# Patient Record
Sex: Male | Born: 1950 | Race: White | Hispanic: No | Smoking: Never smoker
Health system: Southern US, Community
[De-identification: ages and names within clinical notes are randomized; demographics above are authoritative.]

## PROBLEM LIST (undated history)

## (undated) DIAGNOSIS — I2510 Atherosclerotic heart disease of native coronary artery without angina pectoris: Secondary | ICD-10-CM

## (undated) DIAGNOSIS — I10 Essential (primary) hypertension: Secondary | ICD-10-CM

## (undated) DIAGNOSIS — G4730 Sleep apnea, unspecified: Secondary | ICD-10-CM

## (undated) DIAGNOSIS — E119 Type 2 diabetes mellitus without complications: Secondary | ICD-10-CM

## (undated) DIAGNOSIS — G459 Transient cerebral ischemic attack, unspecified: Secondary | ICD-10-CM

## (undated) DIAGNOSIS — I639 Cerebral infarction, unspecified: Secondary | ICD-10-CM

## (undated) DIAGNOSIS — R0600 Dyspnea, unspecified: Secondary | ICD-10-CM

## (undated) DIAGNOSIS — K219 Gastro-esophageal reflux disease without esophagitis: Secondary | ICD-10-CM

## (undated) DIAGNOSIS — I213 ST elevation (STEMI) myocardial infarction of unspecified site: Secondary | ICD-10-CM

## (undated) DIAGNOSIS — F319 Bipolar disorder, unspecified: Secondary | ICD-10-CM

## (undated) DIAGNOSIS — J449 Chronic obstructive pulmonary disease, unspecified: Secondary | ICD-10-CM

## (undated) HISTORY — PX: CATARACT EXTRACTION W/ INTRAOCULAR LENS  IMPLANT, BILATERAL: SHX1307

## (undated) HISTORY — PX: SKIN GRAFT: SHX250

## (undated) HISTORY — PX: OTHER SURGICAL HISTORY: SHX169

## (undated) HISTORY — PX: KNEE ARTHROSCOPY: SHX127

## (undated) HISTORY — PX: CORONARY ARTERY BYPASS GRAFT: SHX141

## (undated) HISTORY — PX: COLON SURGERY: SHX602

## (undated) HISTORY — PX: WRIST ARTHROSCOPY: SUR100

---

## 2017-12-28 ENCOUNTER — Other Ambulatory Visit: Payer: Self-pay | Admitting: Orthopedic Surgery

## 2017-12-28 DIAGNOSIS — R29898 Other symptoms and signs involving the musculoskeletal system: Secondary | ICD-10-CM

## 2017-12-28 DIAGNOSIS — M7541 Impingement syndrome of right shoulder: Secondary | ICD-10-CM

## 2018-01-01 ENCOUNTER — Other Ambulatory Visit (HOSPITAL_COMMUNITY): Payer: Self-pay | Admitting: Orthopedic Surgery

## 2018-01-01 DIAGNOSIS — R29898 Other symptoms and signs involving the musculoskeletal system: Secondary | ICD-10-CM

## 2018-01-01 DIAGNOSIS — M7541 Impingement syndrome of right shoulder: Secondary | ICD-10-CM

## 2018-01-03 ENCOUNTER — Ambulatory Visit (HOSPITAL_COMMUNITY)
Admission: RE | Admit: 2018-01-03 | Discharge: 2018-01-03 | Disposition: A | Discharge status: Home / Self Care | Payer: Medicare Other | Source: Ambulatory Visit | Attending: Orthopedic Surgery | Admitting: Orthopedic Surgery

## 2018-01-03 DIAGNOSIS — M7541 Impingement syndrome of right shoulder: Secondary | ICD-10-CM | POA: Insufficient documentation

## 2018-01-03 DIAGNOSIS — M75111 Incomplete rotator cuff tear or rupture of right shoulder, not specified as traumatic: Secondary | ICD-10-CM | POA: Insufficient documentation

## 2018-01-03 DIAGNOSIS — R29898 Other symptoms and signs involving the musculoskeletal system: Secondary | ICD-10-CM

## 2018-01-12 ENCOUNTER — Ambulatory Visit: Payer: Self-pay

## 2018-01-14 DIAGNOSIS — M7581 Other shoulder lesions, right shoulder: Secondary | ICD-10-CM | POA: Insufficient documentation

## 2018-01-14 DIAGNOSIS — S46011A Strain of muscle(s) and tendon(s) of the rotator cuff of right shoulder, initial encounter: Secondary | ICD-10-CM | POA: Insufficient documentation

## 2018-02-21 ENCOUNTER — Emergency Department (HOSPITAL_BASED_OUTPATIENT_CLINIC_OR_DEPARTMENT_OTHER)
Admission: EM | Admit: 2018-02-21 | Discharge: 2018-02-21 | Disposition: A | Discharge status: Home / Self Care | Payer: Medicare Other | Attending: Emergency Medicine | Admitting: Emergency Medicine

## 2018-02-21 ENCOUNTER — Emergency Department (HOSPITAL_BASED_OUTPATIENT_CLINIC_OR_DEPARTMENT_OTHER): Payer: Medicare Other

## 2018-02-21 ENCOUNTER — Encounter (HOSPITAL_BASED_OUTPATIENT_CLINIC_OR_DEPARTMENT_OTHER): Payer: Self-pay

## 2018-02-21 DIAGNOSIS — I252 Old myocardial infarction: Secondary | ICD-10-CM | POA: Insufficient documentation

## 2018-02-21 DIAGNOSIS — G459 Transient cerebral ischemic attack, unspecified: Secondary | ICD-10-CM | POA: Diagnosis not present

## 2018-02-21 DIAGNOSIS — E1165 Type 2 diabetes mellitus with hyperglycemia: Secondary | ICD-10-CM | POA: Diagnosis not present

## 2018-02-21 DIAGNOSIS — I2510 Atherosclerotic heart disease of native coronary artery without angina pectoris: Secondary | ICD-10-CM | POA: Diagnosis not present

## 2018-02-21 DIAGNOSIS — I10 Essential (primary) hypertension: Secondary | ICD-10-CM | POA: Diagnosis not present

## 2018-02-21 DIAGNOSIS — R739 Hyperglycemia, unspecified: Secondary | ICD-10-CM

## 2018-02-21 DIAGNOSIS — R4182 Altered mental status, unspecified: Secondary | ICD-10-CM | POA: Diagnosis present

## 2018-02-21 HISTORY — DX: Cerebral infarction, unspecified: I63.9

## 2018-02-21 HISTORY — DX: Type 2 diabetes mellitus without complications: E11.9

## 2018-02-21 HISTORY — DX: Transient cerebral ischemic attack, unspecified: G45.9

## 2018-02-21 HISTORY — DX: Atherosclerotic heart disease of native coronary artery without angina pectoris: I25.10

## 2018-02-21 HISTORY — DX: ST elevation (STEMI) myocardial infarction of unspecified site: I21.3

## 2018-02-21 HISTORY — DX: Essential (primary) hypertension: I10

## 2018-02-21 LAB — CBG MONITORING, ED: Glucose-Capillary: 277 mg/dL — ABNORMAL HIGH (ref 70–99)

## 2018-02-21 LAB — COMPREHENSIVE METABOLIC PANEL
ALBUMIN: 3.9 g/dL (ref 3.5–5.0)
ALK PHOS: 77 U/L (ref 38–126)
ALT: 25 U/L (ref 0–44)
ANION GAP: 12 (ref 5–15)
AST: 19 U/L (ref 15–41)
BUN: 28 mg/dL — ABNORMAL HIGH (ref 8–23)
CALCIUM: 8.5 mg/dL — AB (ref 8.9–10.3)
CO2: 22 mmol/L (ref 22–32)
Chloride: 100 mmol/L (ref 98–111)
Creatinine, Ser: 0.96 mg/dL (ref 0.61–1.24)
GFR calc Af Amer: >60 mL/min (ref >60)
GFR calc non Af Amer: >60 mL/min (ref >60)
GLUCOSE: 274 mg/dL — AB (ref 70–99)
Potassium: 4 mmol/L (ref 3.5–5.1)
SODIUM: 134 mmol/L — AB (ref 135–145)
Total Bilirubin: 0.8 mg/dL (ref 0.3–1.2)
Total Protein: 6.9 g/dL (ref 6.5–8.1)

## 2018-02-21 LAB — CBC
HEMATOCRIT: 47.2 % (ref 39.0–52.0)
HEMOGLOBIN: 15.1 g/dL (ref 13.0–17.0)
MCH: 29.1 pg (ref 26.0–34.0)
MCHC: 32 g/dL (ref 30.0–36.0)
MCV: 90.9 fL (ref 80.0–100.0)
NRBC: 0 % (ref 0.0–0.2)
Platelets: 170 10*3/uL (ref 150–400)
RBC: 5.19 MIL/uL (ref 4.22–5.81)
RDW: 13 % (ref 11.5–15.5)
WBC: 10 10*3/uL (ref 4.0–10.5)

## 2018-02-21 LAB — DIFFERENTIAL
ABS IMMATURE GRANULOCYTES: 0.09 10*3/uL — AB (ref 0.00–0.07)
BASOS ABS: 0 10*3/uL (ref 0.0–0.1)
Basophils Relative: 0 %
EOS PCT: 2 %
Eosinophils Absolute: 0.2 10*3/uL (ref 0.0–0.5)
IMMATURE GRANULOCYTES: 1 %
LYMPHS PCT: 25 %
Lymphs Abs: 2.5 10*3/uL (ref 0.7–4.0)
Monocytes Absolute: 0.7 10*3/uL (ref 0.1–1.0)
Monocytes Relative: 7 %
NEUTROS ABS: 6.4 10*3/uL (ref 1.7–7.7)
Neutrophils Relative %: 65 %

## 2018-02-21 LAB — PROTIME-INR
INR: 1.02
Prothrombin Time: 13.3 seconds (ref 11.4–15.2)

## 2018-02-21 LAB — TROPONIN I: Troponin I: <0.03 ng/mL (ref <0.03)

## 2018-02-21 LAB — ETHANOL: Alcohol, Ethyl (B): <10 mg/dL (ref <10)

## 2018-02-21 LAB — APTT: aPTT: 28 seconds (ref 24–36)

## 2018-02-21 MED ORDER — SODIUM CHLORIDE 0.9 % IV BOLUS: 1000.0000 mL | Freq: Once | INTRAVENOUS | Status: DC

## 2018-02-21 NOTE — ED Triage Notes (Signed)
Per daughter they were out shopping and pt stated his eyes were tunnel vision and then he started mumbling words and confused x1715mins ago, gave 2 325mg  ASA and a BC powder 5 mins ago. Neg facial droop, neg drift, unable to tell me his bday

## 2018-02-21 NOTE — ED Notes (Signed)
ED Provider at bedside. 

## 2018-02-21 NOTE — Discharge Instructions (Addendum)
Thank you for allowing me to care for you today in the Emergency Department.   As we discussed, we offered you admission because your symptoms sounded consistent with a TIA, transient ischemic attack, also known as a mini stroke.  A TIA is concerning because it can sometimes be a warning sign that you may have a full-blown stroke in several days.  Continue to take all of your medications including aspirin.  Please follow-up with your primary care provider for recheck as soon as possible.  If you develop any symptoms where you have difficulty with your words, changes in your vision, facial droop, numbness or weakness, confusion, or other new, concerning symptoms, please return to the emergency department or the nearest emergency department if you are in [place] at any time for re-evaluation.

## 2018-02-21 NOTE — ED Provider Notes (Signed)
[facility] EMERGENCY DEPARTMENT Provider Note   CSN: [id number] Arrival date & time: 02/21/18  1701     History   Chief Complaint Chief Complaint  Patient presents with  . Altered Mental Status    HPI Roger Harper is a 67 y.o. male with a history of CAD s/p CABG x5, diabetes mellitus, HTN, STEMI, stroke, and TIA who presents to the emergency department with a chief complaint of dysarthria.  The patient states he was shopping at [employer] with his daughter when he suddenly developed bilateral tunnel vision.  He reports he has had similar issues with his vision before and he was concerned that his blood sugar was low so he ate a piece of pizza.  He reports the tunnel vision lasted for several minutes until they were leaving the building.  After the tunnel vision resolved, he developed dysarthria that lasted for approximately 30 minutes.  His daughter states during that time that she was asking him questions, but he had difficulty with his words.  She reports he has had similar issues in the past and his neurologist had asked him to try and read a sentence if it happened again.  The patient states that he was able to recognize the words on the paper and knew what he wanted to say, but was unable to get the words out.  She reports they stopped at a gas station and gave him 325 mg of ASA and a BC powder 5 minutes prior to arrival.  He denies of fever, chills, numbness, weakness, facial droop, slurred speech, headache, diplopia, chest pain, dyspnea, nausea, vomiting, or diarrhea.  The history is provided by the patient. No language interpreter was used.    Past Medical History:  Diagnosis Date  . Coronary artery disease   . Diabetes mellitus without complication (HCC)   . Hypertension   . STEMI (ST elevation myocardial infarction) (HCC)   . Stroke (HCC)   . TIA (transient ischemic attack)     There are no active problems to display for this patient.   Past Surgical  History:  Procedure Laterality Date  . CORONARY ARTERY BYPASS GRAFT          Home Medications    Prior to Admission medications   Not on File    Family History No family history on file.  Social History Social History   Tobacco Use  . Smoking status: Never Smoker  . Smokeless tobacco: Never Used  Substance Use Topics  . Alcohol use: Not Currently  . Drug use: Not Currently     Allergies   Penicillins and Tetracyclines & related   Review of Systems Review of Systems  Constitutional: Negative for appetite change and fever.  HENT: Negative for congestion.   Eyes: Positive for visual disturbance.  Respiratory: Negative for shortness of breath and wheezing.   Cardiovascular: Negative for chest pain, palpitations and leg swelling.  Gastrointestinal: Negative for abdominal pain, diarrhea, nausea and vomiting.  Genitourinary: Negative for dysuria.  Musculoskeletal: Negative for back pain.  Skin: Negative for rash.  Allergic/Immunologic: Negative for immunocompromised state.  Neurological: Negative for seizures, syncope, weakness, numbness and headaches.       Dysarthria  Psychiatric/Behavioral: Positive for confusion.   Physical Exam Updated Vital Signs BP (!) 170/87   Pulse 76   Temp 98.5 F (36.9 C) (Oral)   Resp (!) 22   Wt 131 kg   SpO2 94%   Physical Exam  Constitutional: He is oriented to person,  place, and time. He appears well-developed. No distress.  Obese male.  HENT:  Head: Normocephalic.  Eyes: Pupils are equal, round, and reactive to light. Conjunctivae and EOM are normal. No scleral icterus.  Neck: Normal range of motion. Neck supple.  Cardiovascular: Normal rate, regular rhythm, normal heart sounds and intact distal pulses. Exam reveals no gallop and no friction rub.  No murmur heard. Pulmonary/Chest: Effort normal. No stridor. No respiratory distress. He has no wheezes. He has no rales. He exhibits no tenderness.  Abdominal: Soft. He  exhibits no distension and no mass. There is no tenderness. There is no rebound and no guarding. No hernia.  Neurological: He is alert and oriented to person, place, and time.  Cranial nerves II through XII are grossly intact.  Finger-to-nose is intact bilaterally.  Follows simple and complex commands.  Speaks in complete, fluent sentences.  Is alert and oriented x4.  5 out of 5 strength against resistance of the bilateral upper and lower extremities.  No pronator drift.  Symmetric tandem gait.  Declines heel-to-shin test due to prior bilateral total knee replacements.  Skin: Skin is warm and dry. He is not diaphoretic.  Psychiatric: His behavior is normal.  Nursing note and vitals reviewed.    ED Treatments / Results  Labs (all labs ordered are listed, but only abnormal results are displayed) Labs Reviewed  DIFFERENTIAL - Abnormal; Notable for the following components:      Result Value   Abs Immature Granulocytes 0.09 (*)    All other components within normal limits  COMPREHENSIVE METABOLIC PANEL - Abnormal; Notable for the following components:   Sodium 134 (*)    Glucose, Bld 274 (*)    BUN 28 (*)    Calcium 8.5 (*)    All other components within normal limits  CBG MONITORING, ED - Abnormal; Notable for the following components:   Glucose-Capillary 277 (*)    All other components within normal limits  ETHANOL  PROTIME-INR  APTT  CBC  TROPONIN I    EKG EKG Interpretation  Date/Time:  Thursday February 21 2018 17:08:55 EST Ventricular Rate:  80 PR Interval:  140 QRS Duration: 92 QT Interval:  402 QTC Calculation: 463 R Axis:   75 Text Interpretation:  Normal sinus rhythm Nonspecific T wave abnormality Prolonged QT Abnormal ECG No old tracing to compare Confirmed by Rolan Bucco [id number]) on 02/21/2018 6:37:43 PM   Radiology Ct Head Wo Contrast  Result Date: 02/21/2018 CLINICAL DATA:  Tunnel vision while out shopping. EXAM: CT HEAD WITHOUT CONTRAST TECHNIQUE:  Contiguous axial images were obtained from the base of the skull through the vertex without intravenous contrast. COMPARISON:  None. FINDINGS: Brain: Superficial and central atrophy with chronic appearing small vessel ischemia. No large vascular territory infarct, hemorrhage or midline shift. Midline fourth ventricle and basal cisterns. No intra-axial mass nor extra-axial fluid collections. Vascular: Atherosclerosis of the vertebral and both carotid siphons. No hyperdense vessel sign. Skull: Normal. Negative for fracture or focal lesion. Sinuses/Orbits: Bilateral lens replacements. Intact orbits and globes. Paranasal sinuses are nonacute. Other: None IMPRESSION: Atrophy with chronic small vessel ischemia. No acute intracranial abnormality. Electronically Signed   By: Tollie Eth M.D.   On: 02/21/2018 18:07    Procedures Procedures (including critical care time)  Medications Ordered in ED Medications - No data to display   Initial Impression / Assessment and Plan / ED Course  I have reviewed the triage vital signs and the nursing notes.  Pertinent labs & imaging  results that were available during my care of the patient were reviewed by me and considered in my medical decision making (see chart for details).     67 year old male with a history of CAD s/p CABG x5, diabetes mellitus, HTN, STEMI, stroke, and TIA who presents to the emergency department with transient dysarthria, visual changes, and confusion that resolved prior to my evaluation in the ED.  The patient was seen and independently evaluated by Dr. Fredderick Phenix, attending physician.  He is here with nonspecific T wave abnormality and sinus rhythm with no previous for comparison.  Labs are notable for glucose of 74.  Normal anion gap and bicarb is 22.  CT head is unremarkable.  Ordered IV fluids to treat hyperglycemia and discussed admission for TIA with the patient and his daughter.  The patient states that a family member in [place] is ill and  he is leaving for [place] in the morning because she may likely pass away soon.  He declines admission at this time.  Discussed that TIA is often the harboring her for CVA.  I have discussed my concerns as a provider and the possibility that this may worsen. We discussed the nature, risks and benefits, and alternatives to treatment. I have specifically discussed that without further evaluation I cannot guarantee there is not a life threatening event occuring.  Time was given to allow the opportunity to ask questions and consider the options, and after the discussion, the patient decided to refuse the offered treatment. Pt is A&Ox4, his own POA and states understanding of my concerns and the possible consequences. After refusal, I made every reasonable opportunity to treat them to the best of my ability. I have made the patient aware that this is an AMA discharge, but he may return at any time for further evaluation and treatment.  I have also advised the patient follow-up with primary care and his cardiologist as soon as possible for reevaluation.  Final Clinical Impressions(s) / ED Diagnoses   Final diagnoses:  TIA (transient ischemic attack)  Hyperglycemia    ED Discharge Orders    None       Alie Moudy, Coral Else, PA-C 02/21/18 2217    Rolan Bucco, MD 02/21/18 2321

## 2018-02-21 NOTE — ED Notes (Signed)
Pt wants to leave AMA. Provider aware, in to see pt.

## 2018-02-21 NOTE — ED Notes (Signed)
Patient transported to CT 

## 2018-03-13 ENCOUNTER — Other Ambulatory Visit: Payer: Medicare Other

## 2018-04-15 ENCOUNTER — Other Ambulatory Visit: Payer: Self-pay

## 2018-04-15 ENCOUNTER — Encounter
Admission: RE | Admit: 2018-04-15 | Discharge: 2018-04-15 | Disposition: A | Discharge status: Home / Self Care | Payer: Medicare Other | Source: Ambulatory Visit | Attending: Surgery | Admitting: Surgery

## 2018-04-15 DIAGNOSIS — Z01818 Encounter for other preprocedural examination: Secondary | ICD-10-CM | POA: Insufficient documentation

## 2018-04-15 HISTORY — DX: Gastro-esophageal reflux disease without esophagitis: K21.9

## 2018-04-15 HISTORY — DX: Sleep apnea, unspecified: G47.30

## 2018-04-15 HISTORY — DX: Bipolar disorder, unspecified: F31.9

## 2018-04-15 HISTORY — DX: Dyspnea, unspecified: R06.00

## 2018-04-15 NOTE — Patient Instructions (Signed)
Your procedure is scheduled on: 04/23/18 Tues Report to Same Day Surgery 2nd floor medical mall [facility] Entrance-take elevator on left to 2nd floor.  Check in with surgery information desk.) To find out your arrival time please call [phone] between 1PM - 3PM on 04/22/18 Mon  Remember: Instructions that are not followed completely may result in serious medical risk, up to and including death, or upon the discretion of your surgeon and anesthesiologist your surgery may need to be rescheduled.    _x___ 1. Do not eat food after midnight the night before your procedure. You may drink clear liquids up to 2 hours before you are scheduled to arrive at the hospital for your procedure.  Do not drink clear liquids within 2 hours of your scheduled arrival to the hospital.  Clear liquids include  --Water or Apple juice without pulp  --Clear carbohydrate beverage such as ClearFast or Gatorade  --Black Coffee or Clear Tea (No milk, no creamers, do not add anything to                  the coffee or Tea Type 1 and type 2 diabetics should only drink water.   ____Ensure clear carbohydrate drink on the way to the hospital for bariatric patients  ____Ensure clear carbohydrate drink 3 hours before surgery for Dr Rutherford Nail patients if physician instructed.   No gum chewing or hard candies.     __x__ 2. No Alcohol for 24 hours before or after surgery.   __x__3. No Smoking or e-cigarettes for 24 prior to surgery.  Do not use any chewable tobacco products for at least 6 hour prior to surgery   ____  4. Bring all medications with you on the day of surgery if instructed.    __x__ 5. Notify your doctor if there is any change in your medical condition     (cold, fever, infections).    x___6. On the morning of surgery brush your teeth with toothpaste and water.  You may rinse your mouth with mouth wash if you wish.  Do not swallow any toothpaste or mouthwash.   Do not wear jewelry, make-up, hairpins,  clips or nail polish.  Do not wear lotions, powders, or perfumes. You may wear deodorant.  Do not shave 48 hours prior to surgery. Men may shave face and neck.  Do not bring valuables to the hospital.    [facility] is not responsible for any belongings or valuables.               Contacts, dentures or bridgework may not be worn into surgery.  Leave your suitcase in the car. After surgery it may be brought to your room.  For patients admitted to the hospital, discharge time is determined by your                       treatment team.  _  Patients discharged the day of surgery will not be allowed to drive home.  You will need someone to drive you home and stay with you the night of your procedure.    Please read over the following fact sheets that you were given:   [facility] Preparing for Surgery and or MRSA Information   _x___ Take anti-hypertensive listed below, cardiac, seizure, asthma,     anti-reflux and psychiatric medicines. These include:  1. metoprolol tartrate (LOPRESSOR  2.  3.  4.  5.  6.  ____Fleets enema or Magnesium Citrate  as directed.   _x___ Use CHG Soap or sage wipes as directed on instruction sheet   ____ Use inhalers on the day of surgery and bring to hospital day of surgery  _x___ Stop Metformin and Janumet 2 days prior to surgery.    ____ Take 1/2 of usual insulin dose the night before surgery and none on the morning     surgery.   _x___ Follow recommendations from Cardiologist, Pulmonologist or PCP regarding          stopping Aspirin, Coumadin, Plavix ,Eliquis, Effient, or Pradaxa, and Pletal. Instructed by MD not to stop Aspirin.  X____Stop Anti-inflammatories such as Advil, Aleve, Ibuprofen, Motrin, Naproxen, Naprosyn, Goodies powders or aspirin products. OK to take Tylenol and                          Celebrex.   _x___ Stop supplements until after surgery.  But may continue Vitamin D, Vitamin B,       and multivitamin. Stop fish oil.   ____ Bring  C-Pap to the hospital.

## 2018-04-22 MED ORDER — CLINDAMYCIN PHOSPHATE 900 MG/50ML IV SOLN
900.0000 mg | Freq: Once | INTRAVENOUS | Status: AC
  Administered 2018-04-23: 900 mg via INTRAVENOUS

## 2018-04-23 ENCOUNTER — Ambulatory Visit: Payer: Medicare Other | Admitting: Anesthesiology

## 2018-04-23 ENCOUNTER — Encounter
Admission: RE | Disposition: A | Discharge status: Home / Self Care | Payer: Self-pay | Source: Ambulatory Visit | Attending: Surgery

## 2018-04-23 ENCOUNTER — Other Ambulatory Visit: Payer: Self-pay

## 2018-04-23 ENCOUNTER — Ambulatory Visit
Admission: RE | Admit: 2018-04-23 | Discharge: 2018-04-23 | Disposition: A | Discharge status: Home / Self Care | Payer: Medicare Other | Source: Ambulatory Visit | Attending: Surgery | Admitting: Surgery

## 2018-04-23 DIAGNOSIS — I252 Old myocardial infarction: Secondary | ICD-10-CM | POA: Diagnosis not present

## 2018-04-23 DIAGNOSIS — Z7982 Long term (current) use of aspirin: Secondary | ICD-10-CM | POA: Insufficient documentation

## 2018-04-23 DIAGNOSIS — K219 Gastro-esophageal reflux disease without esophagitis: Secondary | ICD-10-CM | POA: Insufficient documentation

## 2018-04-23 DIAGNOSIS — Z881 Allergy status to other antibiotic agents status: Secondary | ICD-10-CM | POA: Insufficient documentation

## 2018-04-23 DIAGNOSIS — Z79899 Other long term (current) drug therapy: Secondary | ICD-10-CM | POA: Insufficient documentation

## 2018-04-23 DIAGNOSIS — F319 Bipolar disorder, unspecified: Secondary | ICD-10-CM | POA: Diagnosis not present

## 2018-04-23 DIAGNOSIS — M75101 Unspecified rotator cuff tear or rupture of right shoulder, not specified as traumatic: Secondary | ICD-10-CM | POA: Insufficient documentation

## 2018-04-23 DIAGNOSIS — Z8673 Personal history of transient ischemic attack (TIA), and cerebral infarction without residual deficits: Secondary | ICD-10-CM | POA: Insufficient documentation

## 2018-04-23 DIAGNOSIS — Z880 Allergy status to penicillin: Secondary | ICD-10-CM | POA: Diagnosis not present

## 2018-04-23 DIAGNOSIS — M7521 Bicipital tendinitis, right shoulder: Secondary | ICD-10-CM | POA: Insufficient documentation

## 2018-04-23 DIAGNOSIS — I2510 Atherosclerotic heart disease of native coronary artery without angina pectoris: Secondary | ICD-10-CM | POA: Insufficient documentation

## 2018-04-23 DIAGNOSIS — E119 Type 2 diabetes mellitus without complications: Secondary | ICD-10-CM | POA: Diagnosis not present

## 2018-04-23 DIAGNOSIS — E785 Hyperlipidemia, unspecified: Secondary | ICD-10-CM | POA: Insufficient documentation

## 2018-04-23 DIAGNOSIS — Z7984 Long term (current) use of oral hypoglycemic drugs: Secondary | ICD-10-CM | POA: Insufficient documentation

## 2018-04-23 DIAGNOSIS — M25811 Other specified joint disorders, right shoulder: Secondary | ICD-10-CM | POA: Insufficient documentation

## 2018-04-23 DIAGNOSIS — I10 Essential (primary) hypertension: Secondary | ICD-10-CM | POA: Diagnosis not present

## 2018-04-23 DIAGNOSIS — Z951 Presence of aortocoronary bypass graft: Secondary | ICD-10-CM | POA: Insufficient documentation

## 2018-04-23 HISTORY — PX: SHOULDER ARTHROSCOPY WITH OPEN ROTATOR CUFF REPAIR: SHX6092

## 2018-04-23 LAB — GLUCOSE, CAPILLARY
GLUCOSE-CAPILLARY: 325 mg/dL — AB (ref 70–99)
Glucose-Capillary: 285 mg/dL — ABNORMAL HIGH (ref 70–99)

## 2018-04-23 SURGERY — ARTHROSCOPY, SHOULDER WITH REPAIR, ROTATOR CUFF, OPEN
Anesthesia: General | Site: Shoulder | Laterality: Right

## 2018-04-23 MED ORDER — FENTANYL CITRATE (PF) 100 MCG/2ML IJ SOLN: 25.0000 ug | INTRAMUSCULAR | Status: DC | PRN

## 2018-04-23 MED ORDER — ACETAMINOPHEN 10 MG/ML IV SOLN
INTRAVENOUS | Status: AC
  Filled 2018-04-23: qty 100

## 2018-04-23 MED ORDER — ROCURONIUM BROMIDE 100 MG/10ML IV SOLN
INTRAVENOUS | Status: DC | PRN
  Administered 2018-04-23: 70 mg via INTRAVENOUS

## 2018-04-23 MED ORDER — BUPIVACAINE HCL (PF) 0.5 % IJ SOLN
INTRAMUSCULAR | Status: DC | PRN
  Administered 2018-04-23: 10 mL via PERINEURAL

## 2018-04-23 MED ORDER — INSULIN ASPART 100 UNIT/ML ~~LOC~~ SOLN
7.0000 [IU] | Freq: Once | SUBCUTANEOUS | Status: AC
  Administered 2018-04-23: 7 [IU] via SUBCUTANEOUS

## 2018-04-23 MED ORDER — ONDANSETRON HCL 4 MG PO TABS: 4.0000 mg | ORAL_TABLET | Freq: Four times a day (QID) | ORAL | Status: DC | PRN

## 2018-04-23 MED ORDER — EPINEPHRINE PF 1 MG/ML IJ SOLN
INTRAMUSCULAR | Status: AC
  Filled 2018-04-23: qty 1

## 2018-04-23 MED ORDER — IPRATROPIUM-ALBUTEROL 0.5-2.5 (3) MG/3ML IN SOLN
RESPIRATORY_TRACT | Status: AC
  Filled 2018-04-23: qty 3

## 2018-04-23 MED ORDER — PHENYLEPHRINE HCL 10 MG/ML IJ SOLN
INTRAMUSCULAR | Status: DC | PRN
  Administered 2018-04-23: 100 ug via INTRAVENOUS

## 2018-04-23 MED ORDER — PHENYLEPHRINE HCL-NACL 10-0.9 MG/250ML-% IV SOLN
INTRAVENOUS | Status: DC | PRN
  Administered 2018-04-23: 30 ug/min via INTRAVENOUS

## 2018-04-23 MED ORDER — ONDANSETRON HCL 4 MG/2ML IJ SOLN: 4.0000 mg | Freq: Four times a day (QID) | INTRAMUSCULAR | Status: DC | PRN

## 2018-04-23 MED ORDER — OXYCODONE HCL 5 MG PO TABS: 5.0000 mg | ORAL_TABLET | ORAL | Status: DC | PRN

## 2018-04-23 MED ORDER — ONDANSETRON HCL 4 MG/2ML IJ SOLN
INTRAMUSCULAR | Status: AC
  Filled 2018-04-23: qty 2

## 2018-04-23 MED ORDER — FENTANYL CITRATE (PF) 100 MCG/2ML IJ SOLN
INTRAMUSCULAR | Status: DC | PRN
  Administered 2018-04-23: 75 ug via INTRAVENOUS

## 2018-04-23 MED ORDER — BUPIVACAINE LIPOSOME 1.3 % IJ SUSP
INTRAMUSCULAR | Status: DC | PRN
  Administered 2018-04-23: 20 mL via PERINEURAL

## 2018-04-23 MED ORDER — SUCCINYLCHOLINE CHLORIDE 20 MG/ML IJ SOLN
INTRAMUSCULAR | Status: DC | PRN
  Administered 2018-04-23: 120 mg via INTRAVENOUS

## 2018-04-23 MED ORDER — MIDAZOLAM HCL 2 MG/2ML IJ SOLN
1.0000 mg | Freq: Once | INTRAMUSCULAR | Status: AC
  Administered 2018-04-23: 1 mg via INTRAVENOUS

## 2018-04-23 MED ORDER — ROCURONIUM BROMIDE 50 MG/5ML IV SOLN
INTRAVENOUS | Status: AC
  Filled 2018-04-23: qty 2

## 2018-04-23 MED ORDER — LIDOCAINE HCL (PF) 2 % IJ SOLN
INTRAMUSCULAR | Status: AC
  Filled 2018-04-23: qty 10

## 2018-04-23 MED ORDER — FAMOTIDINE 20 MG PO TABS
20.0000 mg | ORAL_TABLET | Freq: Once | ORAL | Status: AC
  Administered 2018-04-23: 20 mg via ORAL

## 2018-04-23 MED ORDER — METOPROLOL TARTRATE 5 MG/5ML IV SOLN
INTRAVENOUS | Status: AC
  Filled 2018-04-23: qty 5

## 2018-04-23 MED ORDER — SUGAMMADEX SODIUM 200 MG/2ML IV SOLN
INTRAVENOUS | Status: DC | PRN
  Administered 2018-04-23: 100 mg via INTRAVENOUS
  Administered 2018-04-23: 260 mg via INTRAVENOUS

## 2018-04-23 MED ORDER — LIDOCAINE HCL (CARDIAC) PF 100 MG/5ML IV SOSY
PREFILLED_SYRINGE | INTRAVENOUS | Status: DC | PRN
  Administered 2018-04-23: 100 mg via INTRAVENOUS

## 2018-04-23 MED ORDER — METOCLOPRAMIDE HCL 5 MG/ML IJ SOLN: 5.0000 mg | Freq: Three times a day (TID) | INTRAMUSCULAR | Status: DC | PRN

## 2018-04-23 MED ORDER — OXYCODONE HCL 5 MG PO TABS: 5.0000 mg | ORAL_TABLET | ORAL | 0 refills | Status: AC | PRN

## 2018-04-23 MED ORDER — INSULIN ASPART 100 UNIT/ML ~~LOC~~ SOLN
SUBCUTANEOUS | Status: AC
  Administered 2018-04-23: 7 [IU] via SUBCUTANEOUS
  Filled 2018-04-23: qty 1

## 2018-04-23 MED ORDER — PROPOFOL 10 MG/ML IV BOLUS
INTRAVENOUS | Status: DC | PRN
  Administered 2018-04-23: 200 mg via INTRAVENOUS

## 2018-04-23 MED ORDER — FENTANYL CITRATE (PF) 250 MCG/5ML IJ SOLN
INTRAMUSCULAR | Status: AC
  Filled 2018-04-23: qty 5

## 2018-04-23 MED ORDER — BUPIVACAINE-EPINEPHRINE (PF) 0.5% -1:200000 IJ SOLN
INTRAMUSCULAR | Status: AC
  Filled 2018-04-23: qty 90

## 2018-04-23 MED ORDER — ACETAMINOPHEN 10 MG/ML IV SOLN
INTRAVENOUS | Status: DC | PRN
  Administered 2018-04-23: 1000 mg via INTRAVENOUS

## 2018-04-23 MED ORDER — SUGAMMADEX SODIUM 500 MG/5ML IV SOLN
INTRAVENOUS | Status: AC
  Filled 2018-04-23: qty 5

## 2018-04-23 MED ORDER — ONDANSETRON HCL 4 MG/2ML IJ SOLN
INTRAMUSCULAR | Status: DC | PRN
  Administered 2018-04-23: 4 mg via INTRAVENOUS

## 2018-04-23 MED ORDER — FENTANYL CITRATE (PF) 100 MCG/2ML IJ SOLN
50.0000 ug | Freq: Once | INTRAMUSCULAR | Status: AC
  Administered 2018-04-23: 50 ug via INTRAVENOUS

## 2018-04-23 MED ORDER — METOCLOPRAMIDE HCL 10 MG PO TABS: 5.0000 mg | ORAL_TABLET | Freq: Three times a day (TID) | ORAL | Status: DC | PRN

## 2018-04-23 MED ORDER — METOPROLOL TARTRATE 5 MG/5ML IV SOLN
2.0000 mg | Freq: Once | INTRAVENOUS | Status: AC
  Administered 2018-04-23: 2 mg via INTRAVENOUS
  Filled 2018-04-23: qty 5

## 2018-04-23 MED ORDER — PROPOFOL 10 MG/ML IV BOLUS
INTRAVENOUS | Status: AC
  Filled 2018-04-23: qty 20

## 2018-04-23 MED ORDER — BUPIVACAINE-EPINEPHRINE 0.5% -1:200000 IJ SOLN
INTRAMUSCULAR | Status: DC | PRN
  Administered 2018-04-23: 30 mL

## 2018-04-23 MED ORDER — GLYCOPYRROLATE 0.2 MG/ML IJ SOLN
INTRAMUSCULAR | Status: AC
  Filled 2018-04-23: qty 1

## 2018-04-23 MED ORDER — LACTATED RINGERS IV SOLN
INTRAVENOUS | Status: DC | PRN
  Administered 2018-04-23: 2 mL

## 2018-04-23 MED ORDER — IPRATROPIUM-ALBUTEROL 0.5-2.5 (3) MG/3ML IN SOLN
3.0000 mL | RESPIRATORY_TRACT | Status: DC
  Administered 2018-04-23: 3 mL via RESPIRATORY_TRACT

## 2018-04-23 MED ORDER — SODIUM CHLORIDE 0.9 % IV SOLN
INTRAVENOUS | Status: DC
  Administered 2018-04-23 (×2): via INTRAVENOUS

## 2018-04-23 MED ORDER — POTASSIUM CHLORIDE IN NACL 20-0.9 MEQ/L-% IV SOLN: INTRAVENOUS | Status: DC

## 2018-04-23 MED ORDER — ONDANSETRON HCL 4 MG/2ML IJ SOLN: 4.0000 mg | Freq: Once | INTRAMUSCULAR | Status: DC | PRN

## 2018-04-23 SURGICAL SUPPLY — 45 items
ANCHOR JUGGERKNOT WTAP NDL 2.9 (Anchor) ×4 IMPLANT
ANCHOR SUT QUATTRO KNTLS 4.5 (Anchor) ×2 IMPLANT
ANCHOR SUT W/ ORTHOCORD (Anchor) ×2 IMPLANT
BIT DRILL JUGRKNT W/NDL BIT2.9 (DRILL) IMPLANT
BLADE FULL RADIUS 3.5 (BLADE) ×3 IMPLANT
BUR ACROMIONIZER 4.0 (BURR) ×3 IMPLANT
CANNULA SHAVER 8MMX76MM (CANNULA) ×5 IMPLANT
CHLORAPREP W/TINT 26ML (MISCELLANEOUS) ×3 IMPLANT
COVER MAYO STAND STRL (DRAPES) ×3 IMPLANT
COVER WAND RF STERILE (DRAPES) ×1 IMPLANT
DRAPE IMP U-DRAPE 54X76 (DRAPES) ×6 IMPLANT
DRILL JUGGERKNOT W/NDL BIT 2.9 (DRILL) ×3
ELECT REM PT RETURN 9FT ADLT (ELECTROSURGICAL) ×3
ELECTRODE REM PT RTRN 9FT ADLT (ELECTROSURGICAL) ×1 IMPLANT
GAUZE PETRO XEROFOAM 1X8 (MISCELLANEOUS) ×3 IMPLANT
GAUZE SPONGE 4X4 12PLY STRL (GAUZE/BANDAGES/DRESSINGS) ×3 IMPLANT
GLOVE BIO SURGEON STRL SZ7.5 (GLOVE) ×6 IMPLANT
GLOVE BIO SURGEON STRL SZ8 (GLOVE) ×6 IMPLANT
GLOVE BIOGEL PI IND STRL 8 (GLOVE) ×1 IMPLANT
GLOVE BIOGEL PI INDICATOR 8 (GLOVE) ×2
GLOVE INDICATOR 8.0 STRL GRN (GLOVE) ×3 IMPLANT
GOWN STRL REUS W/ TWL LRG LVL3 (GOWN DISPOSABLE) ×1 IMPLANT
GOWN STRL REUS W/ TWL XL LVL3 (GOWN DISPOSABLE) ×1 IMPLANT
GOWN STRL REUS W/TWL LRG LVL3 (GOWN DISPOSABLE) ×2
GOWN STRL REUS W/TWL XL LVL3 (GOWN DISPOSABLE) ×2
GRASPER SUT 15 45D LOW PRO (SUTURE) ×2 IMPLANT
IV LACTATED RINGER IRRG 3000ML (IV SOLUTION) ×4
IV LR IRRIG 3000ML ARTHROMATIC (IV SOLUTION) ×2 IMPLANT
MANIFOLD NEPTUNE II (INSTRUMENTS) ×3 IMPLANT
MASK FACE SPIDER DISP (MASK) ×3 IMPLANT
MAT ABSORB  FLUID 56X50 GRAY (MISCELLANEOUS) ×2
MAT ABSORB FLUID 56X50 GRAY (MISCELLANEOUS) ×1 IMPLANT
PACK ARTHROSCOPY SHOULDER (MISCELLANEOUS) ×3 IMPLANT
SLING ARM LRG DEEP (SOFTGOODS) ×1 IMPLANT
SLING ULTRA II LG (MISCELLANEOUS) ×3 IMPLANT
STAPLER SKIN PROX 35W (STAPLE) ×3 IMPLANT
STRAP SAFETY 5IN WIDE (MISCELLANEOUS) ×3 IMPLANT
SUT ETHIBOND 0 MO6 C/R (SUTURE) ×3 IMPLANT
SUT VIC AB 2-0 CT1 27 (SUTURE) ×4
SUT VIC AB 2-0 CT1 TAPERPNT 27 (SUTURE) ×2 IMPLANT
TAPE MICROFOAM 4IN (TAPE) ×3 IMPLANT
TUBING ARTHRO INFLOW-ONLY STRL (TUBING) ×3 IMPLANT
TUBING CONNECTING 10 (TUBING) ×2 IMPLANT
TUBING CONNECTING 10' (TUBING) ×1
WAND WEREWOLF FLOW 90D (MISCELLANEOUS) ×3 IMPLANT

## 2018-04-23 NOTE — Transfer of Care (Signed)
Immediate Anesthesia Transfer of Care Note  Patient: Roger Harper.  Procedure(s) Performed: SHOULDER ARTHROSCOPY WITH  ARTHROSCOPIC AND OPEN ROTATOR CUFF REPAIR, DECOMPRESSION, DEBRIDEMENT (Right Shoulder)  Patient Location: PACU  Anesthesia Type:General  Level of Consciousness: awake and patient cooperative  Airway & Oxygen Therapy: Patient Spontanous Breathing and Patient connected to face mask oxygen  Post-op Assessment: Report given to RN and Post -op Vital signs reviewed and stable  Post vital signs: Reviewed and stable  Last Vitals:  Vitals Value Taken Time  BP 146/82 04/23/2018  4:23 PM  Temp 36 C 04/23/2018  4:22 PM  Pulse 74 04/23/2018  4:29 PM  Resp 18 04/23/2018  4:29 PM  SpO2 96 % 04/23/2018  4:29 PM  Vitals shown include unvalidated device data.  Last Pain:  Vitals:   04/23/18 1257  TempSrc: Tympanic         Complications: No apparent anesthesia complications

## 2018-04-23 NOTE — Anesthesia Preprocedure Evaluation (Addendum)
Anesthesia Evaluation  Patient identified by MRN, date of birth, ID band Patient awake    Reviewed: Allergy & Precautions, H&P , NPO status , Patient's Chart, lab work & pertinent test results, reviewed documented beta blocker date and time   Airway Mallampati: II  TM Distance: >3 FB Neck ROM: full    Dental  (+) Teeth Intact   Pulmonary neg pulmonary ROS, shortness of breath, sleep apnea ,    Pulmonary exam normal        Cardiovascular Exercise Tolerance: Good hypertension, On Medications + CAD and + Past MI  negative cardio ROS Normal cardiovascular exam Rhythm:regular Rate:Normal     Neuro/Psych PSYCHIATRIC DISORDERS Bipolar Disorder TIACVA negative neurological ROS  negative psych ROS   GI/Hepatic negative GI ROS, Neg liver ROS, GERD  ,  Endo/Other  negative endocrine ROSdiabetes  Renal/GU negative Renal ROS  negative genitourinary   Musculoskeletal   Abdominal   Peds  Hematology negative hematology ROS (+)   Anesthesia Other Findings Past Medical History: No date: Bipolar 1 disorder (HCC) No date: Coronary artery disease No date: Diabetes mellitus without complication (HCC) No date: Dyspnea No date: GERD (gastroesophageal reflux disease) No date: Hypertension No date: Sleep apnea No date: STEMI (ST elevation myocardial infarction) (HCC) No date: Stroke (HCC) No date: TIA (transient ischemic attack) Past Surgical History: No date: CATARACT EXTRACTION W/ INTRAOCULAR LENS  IMPLANT, BILATERAL No date: COLON SURGERY     Comment:  diverticulitis No date: CORONARY ARTERY BYPASS GRAFT No date: ctr; Bilateral No date: KNEE ARTHROSCOPY; Bilateral No date: SKIN GRAFT; Left No date: WRIST ARTHROSCOPY; Right   Reproductive/Obstetrics negative OB ROS                             Anesthesia Physical Anesthesia Plan  ASA: III  Anesthesia Plan: General ETT   Post-op Pain Management:   Regional for Post-op pain   Induction:   PONV Risk Score and Plan:   Airway Management Planned:   Additional Equipment:   Intra-op Plan:   Post-operative Plan:   Informed Consent: I have reviewed the patients History and Physical, chart, labs and discussed the procedure including the risks, benefits and alternatives for the proposed anesthesia with the patient or authorized representative who has indicated his/her understanding and acceptance.     Dental Advisory Given  Plan Discussed with: CRNA  Anesthesia Plan Comments:         Anesthesia Quick Evaluation

## 2018-04-23 NOTE — Anesthesia Post-op Follow-up Note (Signed)
Anesthesia QCDR form completed.        

## 2018-04-23 NOTE — Discharge Instructions (Addendum)
Orthopedic discharge instructions: Keep dressing dry and intact.  May shower after dressing changed on post-op day #4 (Saturday).  Cover staples with Band-Aids after drying off. Apply ice frequently to shoulder. Take ibuprofen 800 mg TID with meals for 7-10 days, then as necessary. Take oxycodone as prescribed when needed.  May supplement with ES Tylenol if necessary. Keep shoulder immobilizer on at all times except may remove for bathing purposes. Follow-up in 10-14 days or as scheduled.     Interscalene Nerve Block with Exparel  1.  For your surgery you have received an Interscalene Nerve Block with Exparel. 2. Nerve Blocks affect many types of nerves, including nerves that control movement, pain and normal sensation.  You may experience feelings such as numbness, tingling, heaviness, weakness or the inability to move your arm or the feeling or sensation that your arm has "fallen asleep". 3. A nerve block with Exparel can last up to 5 days.  Usually the weakness wears off first.  The tingling and heaviness usually wear off next.  Finally you may start to notice pain.  Keep in mind that this may occur in any order.  Once a nerve block starts to wear off it is usually completely gone within 60 minutes. 4. ISNB may cause mild shortness of breath, a hoarse voice, blurry vision, unequal pupils, or drooping of the face on the same side as the nerve block.  These symptoms will usually resolve with the numbness.  Very rarely the procedure itself can cause mild seizures. 5. If needed, your surgeon will give you a prescription for pain medication.  It will take about 60 minutes for the oral pain medication to become fully effective.  So, it is recommended that you start taking this medication before the nerve block first begins to wear off, or when you first begin to feel discomfort. 6. Take your pain medication only as prescribed.  Pain medication can cause sedation and decrease your breathing if you take  more than you need for the level of pain that you have. 7. Nausea is a common side effect of many pain medications.  You may want to eat something before taking your pain medicine to prevent nausea. 8. After an Interscalene nerve block, you cannot feel pain, pressure or extremes in temperature in the effected arm.  Because your arm is numb it is at an increased risk for injury.  To decrease the possibility of injury, please practice the following:  a. While you are awake change the position of your arm frequently to prevent too much pressure on any one area for prolonged periods of time. b.  If you have a cast or tight dressing, check the color or your fingers every couple of hours.  Call your surgeon with the appearance of any discoloration (white or blue). c. If you are given a sling to wear before you go home, please wear it  at all times until the block has completely worn off.  Do not get up at night without your sling. d. Please contact [facility]est[facility] or your surgeon if you do not begin to regain sensation after 7 days from the surgery.  Anesthesia may be contacted by calling the Same Day Surgery Department, Mon. through Fri., 6 am to 4 pm at [phone].   e. If you experience any other problems or concerns, please contact your surgeon's office. f. If you experience severe or prolonged shortness of breath go to the nearest emergency department.

## 2018-04-23 NOTE — Op Note (Signed)
04/23/2018  3:51 PM  Patient:   Roger Harper.  Pre-Op Diagnosis:   Near full-thickness rotator cuff tear, right shoulder.  Post-Op Diagnosis:   Impingement/tendinopathy with near full-thickness supraspinatus tendon tear and partial-thickness subscapularis tendon tear, right shoulder.  Procedure:   Limited arthroscopic debridement, arthroscopic repair of subscapularis tendon tear, arthroscopic subacromial decompression, and mini-open rotator cuff repair, right shoulder.  Anesthesia:   General endotracheal with interscalene block placed preoperatively by the anesthesiologist.  Surgeon:   Maryagnes Amos, MD  Assistant:   None  Findings:   As above. There was moderate labral fraying anteriorly and superiorly, as well as extensive synovitis anteriorly and superiorly. The subscapularis tendon demonstrated articular surface tearing of the superior 50% of the insertional fibers, well the supraspinatus tendon demonstrated a near full-thickness tear involving the mid and anterior insertional fibers of the supraspinatus tendon. The remainder of the rotator cuff was in satisfactory condition. The biceps tendon had chronically torn from its labral anchor and retracted distally. The articular surfaces of the glenoid and humerus both were in excellent condition.  Complications:   None  Fluids:   1000 cc  Estimated blood loss:   10 cc  Tourniquet time:   None  Drains:   None  Closure:   Staples      Brief clinical note:   The patient is a 68 year old male with a history of right shoulder pain. The patient's symptoms have progressed despite medications, activity modification, etc. The patient's history and examination are consistent with impingement/tendinopathy with a rotator cuff tear. These findings were confirmed by MRI scan. The patient presents at this time for definitive management of these shoulder symptoms.  Procedure:   The patient underwent placement of an interscalene block  by the anesthesiologist in the preoperative holding area before being brought into the operating room and lain in the supine position. The patient then underwent general endotracheal intubation and anesthesia before being repositioned in the beach chair position using the beach chair positioner. The right shoulder and upper extremity were prepped with ChloraPrep solution before being draped sterilely. Preoperative antibiotics were administered. A timeout was performed to confirm the proper surgical site before the expected portal sites and incision site were injected with 0.5% Sensorcaine with epinephrine. A posterior portal was created and the glenohumeral joint thoroughly inspected with the findings as described above. An anterior portal was created using an outside-in technique. The labrum and rotator cuff were further probed, again confirming the above-noted findings. The areas of labral fraying and synovitis were debrided back to stable margins using the full-radius resector, as was the torn margin of the subscapularis tendon. The ArthroCare wand was inserted and used to debride the remaining stump of the biceps tendon that still was attached to the labrum superiorly. It also was used to obtain hemostasis as well as to "anneal" the labrum superiorly and anteriorly.   A separate superolateral portal site was made using an outside in technique to use as a working portal in order to assist with the arthroscopic repair of the subscapularis tendon tear. This tear was repaired using a single Mitek BioKnotless anchor placed through the anterior portal. The adequacy of repair was assessed both by probing and by externally rotating the arm, and found to be excellent. The instruments were removed from the joint after suctioning the excess fluid.  The camera was repositioned through the posterior portal into the subacromial space. A separate lateral portal was created using an outside-in technique. The 3.5 mm  full-radius resector was introduced and used to perform a subtotal bursectomy. The ArthroCare wand was then inserted and used to remove the periosteal tissue off the undersurface of the anterior third of the acromion as well as to recess the coracoacromial ligament from its attachment along the anterior and lateral margins of the acromion. The 4.0 mm acromionizing bur was introduced and used to complete the decompression by removing the undersurface of the anterior third of the acromion. The full radius resector was reintroduced to remove any residual bony debris before the ArthroCare wand was reintroduced to obtain hemostasis. The instruments were then removed from the subacromial space after suctioning the excess fluid.  An approximately 4-5 cm incision was made over the anterolateral aspect of the shoulder beginning at the anterolateral corner of the acromion and extending distally in line with the bicipital groove. This incision was carried down through the subcutaneous tissues to expose the deltoid fascia. The raphae between the anterior and middle thirds was identified and this plane developed to provide access into the subacromial space. Additional bursal tissues were debrided sharply using Metzenbaum scissors. The rotator cuff tear was readily identified. The margins were debrided sharply with a #15 blade and the exposed greater tuberosity roughened with a rongeur. The tear was repaired using two Biomet 2.9 mm JuggerKnot anchors. These sutures were then brought back laterally and secured using a single [place] QuatroLink anchors to create a two-layer closure. An apparent watertight closure was obtained.  The bicipital groove was identified by palpation and opened for 1-1.5 cm. The biceps tendon stump was retrieved through this defect. The floor of the bicipital groove was roughened with a curet before another Biomet 2.9 mm JuggerKnot anchor was inserted. Both sets of sutures were passed through the  biceps tendon and tied securely to effect the tenodesis. The bicipital sheath was reapproximated using two #0 Ethibond interrupted sutures, incorporating the biceps tendon to further reinforce the tenodesis.  The wound was copiously irrigated with sterile saline solution before the deltoid raphae was reapproximated using 2-0 Vicryl interrupted sutures. The subcutaneous tissues were closed in two layers using 2-0 Vicryl interrupted sutures before the skin was closed using staples. The portal sites also were closed using staples. A sterile bulky dressing was applied to the shoulder before the arm was placed into a shoulder immobilizer. The patient was then awakened, extubated, and returned to the recovery room in satisfactory condition after tolerating the procedure well.

## 2018-04-23 NOTE — Anesthesia Procedure Notes (Signed)
Anesthesia Regional Block: Interscalene brachial plexus block   Pre-Anesthetic Checklist: ,, timeout performed, Correct Patient, Correct Site, Correct Laterality, Correct Procedure, Correct Position, site marked, Risks and benefits discussed,  Surgical consent,  Pre-op evaluation,  At surgeon's request and post-op pain management  Laterality: Right  Prep: chloraprep       Needles:  Injection technique: Single-shot  Needle Type: Echogenic Stimulator Needle     Needle Length: 10cm  Needle Gauge: 20     Additional Needles:   Procedures:, nerve stimulator,,, ultrasound used (permanent image in chart),,,,   Nerve Stimulator or Paresthesia:  Response: biceps flexion,   Additional Responses:   Narrative:  Injection made incrementally with aspirations every 5 mL.  Performed by: Personally   Additional Notes: Functioning IV was confirmed and monitors were applied. . Sterile prep and drape,hand hygiene and sterile gloves were used.  Negative aspiration and negative test dose prior to incremental administration of local anesthetic. The patient tolerated the procedure well.        

## 2018-04-23 NOTE — H&P (Signed)
Paper H&P to be scanned into permanent record. H&P reviewed and patient re-examined. No changes. 

## 2018-04-23 NOTE — Anesthesia Procedure Notes (Signed)
Procedure Name: Intubation Date/Time: 04/23/2018 1:59 PM Performed by: Bernardo Heater, CRNA Pre-anesthesia Checklist: Patient identified, Emergency Drugs available, Suction available and Patient being monitored Patient Re-evaluated:Patient Re-evaluated prior to induction Oxygen Delivery Method: Circle system utilized Preoxygenation: Pre-oxygenation with 100% oxygen Induction Type: IV induction Laryngoscope Size: Mac and 3 Grade View: Grade II Tube type: Oral Tube size: 7.0 mm Number of attempts: 1 Airway Equipment and Method: Stylet Placement Confirmation: ETT inserted through vocal cords under direct vision,  positive ETCO2 and breath sounds checked- equal and bilateral Secured at: 23 cm Tube secured with: Tape Dental Injury: Teeth and Oropharynx as per pre-operative assessment

## 2018-04-24 ENCOUNTER — Encounter: Payer: Self-pay | Admitting: Surgery

## 2018-04-25 NOTE — Anesthesia Postprocedure Evaluation (Signed)
Anesthesia Post Note  Patient: Roger GlassingCharles Robert Iorio Jr.  Procedure(s) Performed: SHOULDER ARTHROSCOPY WITH  ARTHROSCOPIC AND OPEN ROTATOR CUFF REPAIR, DECOMPRESSION, DEBRIDEMENT (Right Shoulder)  Patient location during evaluation: PACU Anesthesia Type: General Level of consciousness: awake and alert Pain management: pain level controlled Vital Signs Assessment: post-procedure vital signs reviewed and stable Respiratory status: spontaneous breathing, nonlabored ventilation, respiratory function stable and patient connected to nasal cannula oxygen Cardiovascular status: blood pressure returned to baseline and stable Postop Assessment: no apparent nausea or vomiting Anesthetic complications: no     Last Vitals:  Vitals:   04/23/18 1930 04/23/18 1935  BP:    Pulse: 73 76  Resp:    Temp:  36.5 C  SpO2: 93% 91%    Last Pain:  Vitals:   04/23/18 1915  TempSrc:   PainSc: 3                  Yevette EdwardsJames G Adams

## 2018-06-27 DIAGNOSIS — F319 Bipolar disorder, unspecified: Secondary | ICD-10-CM | POA: Insufficient documentation

## 2018-06-27 DIAGNOSIS — G4733 Obstructive sleep apnea (adult) (pediatric): Secondary | ICD-10-CM | POA: Insufficient documentation

## 2018-06-27 DIAGNOSIS — I2510 Atherosclerotic heart disease of native coronary artery without angina pectoris: Secondary | ICD-10-CM | POA: Insufficient documentation

## 2018-06-27 DIAGNOSIS — E118 Type 2 diabetes mellitus with unspecified complications: Secondary | ICD-10-CM | POA: Insufficient documentation

## 2018-06-27 DIAGNOSIS — J449 Chronic obstructive pulmonary disease, unspecified: Secondary | ICD-10-CM | POA: Insufficient documentation

## 2018-06-27 DIAGNOSIS — I10 Essential (primary) hypertension: Secondary | ICD-10-CM | POA: Insufficient documentation

## 2018-09-25 DIAGNOSIS — E113299 Type 2 diabetes mellitus with mild nonproliferative diabetic retinopathy without macular edema, unspecified eye: Secondary | ICD-10-CM | POA: Insufficient documentation

## 2018-10-08 ENCOUNTER — Encounter: Payer: Self-pay | Admitting: Emergency Medicine

## 2018-10-08 ENCOUNTER — Other Ambulatory Visit: Payer: Self-pay

## 2018-10-08 ENCOUNTER — Emergency Department
Admission: EM | Admit: 2018-10-08 | Discharge: 2018-10-08 | Discharge status: Against Medical Advice | Payer: Medicare Other | Attending: Emergency Medicine | Admitting: Emergency Medicine

## 2018-10-08 DIAGNOSIS — Z5321 Procedure and treatment not carried out due to patient leaving prior to being seen by health care provider: Secondary | ICD-10-CM | POA: Diagnosis not present

## 2018-10-08 DIAGNOSIS — E1165 Type 2 diabetes mellitus with hyperglycemia: Secondary | ICD-10-CM | POA: Insufficient documentation

## 2018-10-08 DIAGNOSIS — Z7984 Long term (current) use of oral hypoglycemic drugs: Secondary | ICD-10-CM | POA: Insufficient documentation

## 2018-10-08 DIAGNOSIS — I10 Essential (primary) hypertension: Secondary | ICD-10-CM | POA: Diagnosis present

## 2018-10-08 LAB — CBC
HCT: 49.4 % (ref 39.0–52.0)
Hemoglobin: 15.8 g/dL (ref 13.0–17.0)
MCH: 27.8 pg (ref 26.0–34.0)
MCHC: 32 g/dL (ref 30.0–36.0)
MCV: 87 fL (ref 80.0–100.0)
Platelets: 190 10*3/uL (ref 150–400)
RBC: 5.68 MIL/uL (ref 4.22–5.81)
RDW: 13.2 % (ref 11.5–15.5)
WBC: 10.8 10*3/uL — ABNORMAL HIGH (ref 4.0–10.5)
nRBC: 0 % (ref 0.0–0.2)

## 2018-10-08 LAB — BASIC METABOLIC PANEL
Anion gap: 12 (ref 5–15)
BUN: 25 mg/dL — ABNORMAL HIGH (ref 8–23)
CO2: 22 mmol/L (ref 22–32)
Calcium: 9.1 mg/dL (ref 8.9–10.3)
Chloride: 101 mmol/L (ref 98–111)
Creatinine, Ser: 0.99 mg/dL (ref 0.61–1.24)
GFR calc Af Amer: >60 mL/min (ref >60)
GFR calc non Af Amer: >60 mL/min (ref >60)
Glucose, Bld: 308 mg/dL — ABNORMAL HIGH (ref 70–99)
Potassium: 4.1 mmol/L (ref 3.5–5.1)
Sodium: 135 mmol/L (ref 135–145)

## 2018-10-08 LAB — GLUCOSE, CAPILLARY: Glucose-Capillary: 295 mg/dL — ABNORMAL HIGH (ref 70–99)

## 2018-10-08 NOTE — ED Triage Notes (Signed)
Patient presents to the ED with hypertension and hyperglycemia for approx. 1 week.  Patient states his blood sugar has been greater than 400 most of the times he has checked it over the past week.  Patient states he has been taking metformin and glipizide as prescribed.  Patient states he has been out of his lisinopril for over a week.  Patient is generally seen at the [facility].  Patient states he has had difficulty getting in touch with providers there.  Patient was seen at [facility] today for a procedure to his eyes, to "close off blood vessels".  Patient reports difficulty with vision.

## 2018-10-08 NOTE — ED Notes (Signed)
Pt states his blood sugar is improved and he has an appt Thursday with his pcp. Will be going home and following up with his pcp at that time. Pt verbalized understanding of risks of leaving.

## 2018-10-11 DIAGNOSIS — E782 Mixed hyperlipidemia: Secondary | ICD-10-CM | POA: Insufficient documentation

## 2019-10-21 ENCOUNTER — Ambulatory Visit: Payer: Medicare Other | Attending: Internal Medicine

## 2019-10-21 ENCOUNTER — Other Ambulatory Visit: Payer: Self-pay

## 2019-10-21 DIAGNOSIS — Z23 Encounter for immunization: Secondary | ICD-10-CM

## 2019-10-21 NOTE — Progress Notes (Addendum)
   [facility]  Name:  Roger Harper.    MRN: [id number] DOB: [birth year]  10/21/2019  [name] was observed post Covid-19 immunization for 30 minutes without incident. He was provided with Vaccine Information Sheet and instruction to access the V-Safe system.   [name] was instructed to call 911 with any severe reactions post vaccine: Marland Kitchen Difficulty breathing  . Swelling of face and throat  . A fast heartbeat  . A bad rash all over body  . Dizziness and weakness   Immunizations Administered    Name Date Dose VIS Date Route   Pfizer COVID-19 Vaccine 10/21/2019  1:00 PM 0.3 mL 05/28/2018 Intramuscular   Manufacturer: [employer], [employer]   Lot: [id number]   NDC: [address]-1

## 2019-11-11 ENCOUNTER — Ambulatory Visit: Payer: Medicare Other | Attending: Internal Medicine

## 2019-11-11 DIAGNOSIS — Z23 Encounter for immunization: Secondary | ICD-10-CM

## 2019-11-11 NOTE — Progress Notes (Signed)
   [facility]  Name:  Roger Harper.    MRN: [id number] DOB: 02/22/1951  11/11/2019  Roger Harper was observed post Covid-19 immunization for 15 minutes without incident. He was provided with Vaccine Information Sheet and instruction to access the V-Safe system.   Roger Harper was instructed to call 911 with any severe reactions post vaccine: Marland Kitchen Difficulty breathing  . Swelling of face and throat  . A fast heartbeat  . A bad rash all over body  . Dizziness and weakness   Immunizations Administered    Name Date Dose VIS Date Route   Pfizer COVID-19 Vaccine 11/11/2019  1:16 PM 0.3 mL 05/28/2018 Intramuscular   Manufacturer: [employer], [employer]   Lot: [id number]   NDC: [address]-2

## 2020-05-04 ENCOUNTER — Emergency Department: Payer: Medicare Other

## 2020-05-04 ENCOUNTER — Encounter: Payer: Self-pay | Admitting: Emergency Medicine

## 2020-05-04 ENCOUNTER — Emergency Department
Admission: EM | Admit: 2020-05-04 | Discharge: 2020-05-04 | Disposition: A | Discharge status: Home / Self Care | Payer: Medicare Other | Attending: Emergency Medicine | Admitting: Emergency Medicine

## 2020-05-04 ENCOUNTER — Other Ambulatory Visit: Payer: Self-pay

## 2020-05-04 DIAGNOSIS — J449 Chronic obstructive pulmonary disease, unspecified: Secondary | ICD-10-CM | POA: Insufficient documentation

## 2020-05-04 DIAGNOSIS — I10 Essential (primary) hypertension: Secondary | ICD-10-CM | POA: Insufficient documentation

## 2020-05-04 DIAGNOSIS — W06XXXA Fall from bed, initial encounter: Secondary | ICD-10-CM | POA: Insufficient documentation

## 2020-05-04 DIAGNOSIS — Z7982 Long term (current) use of aspirin: Secondary | ICD-10-CM | POA: Diagnosis not present

## 2020-05-04 DIAGNOSIS — Z951 Presence of aortocoronary bypass graft: Secondary | ICD-10-CM | POA: Diagnosis not present

## 2020-05-04 DIAGNOSIS — E119 Type 2 diabetes mellitus without complications: Secondary | ICD-10-CM | POA: Insufficient documentation

## 2020-05-04 DIAGNOSIS — S3992XA Unspecified injury of lower back, initial encounter: Secondary | ICD-10-CM | POA: Diagnosis present

## 2020-05-04 DIAGNOSIS — Z7984 Long term (current) use of oral hypoglycemic drugs: Secondary | ICD-10-CM | POA: Diagnosis not present

## 2020-05-04 DIAGNOSIS — S322XXA Fracture of coccyx, initial encounter for closed fracture: Secondary | ICD-10-CM

## 2020-05-04 DIAGNOSIS — Z8673 Personal history of transient ischemic attack (TIA), and cerebral infarction without residual deficits: Secondary | ICD-10-CM | POA: Insufficient documentation

## 2020-05-04 DIAGNOSIS — I2510 Atherosclerotic heart disease of native coronary artery without angina pectoris: Secondary | ICD-10-CM | POA: Diagnosis not present

## 2020-05-04 DIAGNOSIS — U071 COVID-19: Secondary | ICD-10-CM | POA: Diagnosis not present

## 2020-05-04 DIAGNOSIS — Z79899 Other long term (current) drug therapy: Secondary | ICD-10-CM | POA: Diagnosis not present

## 2020-05-04 DIAGNOSIS — R531 Weakness: Secondary | ICD-10-CM

## 2020-05-04 HISTORY — DX: Chronic obstructive pulmonary disease, unspecified: J44.9

## 2020-05-04 LAB — BASIC METABOLIC PANEL
Anion gap: 11 (ref 5–15)
BUN: 16 mg/dL (ref 8–23)
CO2: 22 mmol/L (ref 22–32)
Calcium: 8.5 mg/dL — ABNORMAL LOW (ref 8.9–10.3)
Chloride: 101 mmol/L (ref 98–111)
Creatinine, Ser: 0.96 mg/dL (ref 0.61–1.24)
GFR, Estimated: >60 mL/min (ref >60)
Glucose, Bld: 225 mg/dL — ABNORMAL HIGH (ref 70–99)
Potassium: 3.6 mmol/L (ref 3.5–5.1)
Sodium: 134 mmol/L — ABNORMAL LOW (ref 135–145)

## 2020-05-04 LAB — SARS CORONAVIRUS 2 BY RT PCR (HOSPITAL ORDER, PERFORMED IN ~~LOC~~ HOSPITAL LAB): SARS Coronavirus 2: POSITIVE — AB

## 2020-05-04 LAB — CBC
HCT: 41.7 % (ref 39.0–52.0)
Hemoglobin: 14 g/dL (ref 13.0–17.0)
MCH: 28.8 pg (ref 26.0–34.0)
MCHC: 33.6 g/dL (ref 30.0–36.0)
MCV: 85.8 fL (ref 80.0–100.0)
Platelets: 153 10*3/uL (ref 150–400)
RBC: 4.86 MIL/uL (ref 4.22–5.81)
RDW: 13.7 % (ref 11.5–15.5)
WBC: 8.1 10*3/uL (ref 4.0–10.5)
nRBC: 0 % (ref 0.0–0.2)

## 2020-05-04 LAB — MAGNESIUM: Magnesium: 1.5 mg/dL — ABNORMAL LOW (ref 1.7–2.4)

## 2020-05-04 MED ORDER — ACETAMINOPHEN 500 MG PO TABS
1000.0000 mg | ORAL_TABLET | Freq: Once | ORAL | Status: AC
  Administered 2020-05-04: 1000 mg via ORAL
  Filled 2020-05-04: qty 2

## 2020-05-04 NOTE — ED Triage Notes (Signed)
Patient c/o weakness today.  Daughter and one of her children, who live in the house, are COVID positive.  Per report, daughter states patient had a fever this morning.  Patient also fell this morning out of bed, and is c/o coccyx pain.

## 2020-05-04 NOTE — Discharge Instructions (Signed)
As we discussed you have a crack/fracture through your tailbone that will heal on its own. Please use a circular/doughnut pillow to sit on to help relieve this pressure.  Use Tylenol for pain and fevers.  Up to 1000 mg per dose, up to 4 times per day.  Do not take more than 4000 mg of Tylenol/acetaminophen within 24 hours..  You tested positive for COVID-19. Please stay masked and quarantined at home.   Continue to take all of your other typical medications. Follow-up with your PCP in the next 1-2 weeks.  Return to the ED with any worsening breathing symptoms, new chest pains or passing out

## 2020-05-04 NOTE — ED Triage Notes (Signed)
First Nurse Note:  Arrives via [facility].  Patient c/o weakness today.  Daughter and one of her children, who live in the house, are COVID positive.  Per report, daughter states patient had a fever this morning.  Patient also fell this morning, and is c/o coccyx pain.  VS wnl.

## 2020-05-04 NOTE — ED Notes (Addendum)
See triage note. Resp reg/unlabored currently; cough present per pt; states this cough is chronic bc of COPD; denies SOB; skin oily at face but otherwise dry. Pt given urinal and notified urine sample needed.

## 2020-05-04 NOTE — ED Provider Notes (Signed)
[facility] Emergency Department Provider Note ____________________________________________   Event Date/Time   First MD Initiated Contact with Patient 05/04/20 [phone]     (approximate)  I have reviewed the triage vital signs and the nursing notes.  HISTORY  Chief Complaint Weakness and Fever   HPI Roger Harper. is a 70 y.o. malewho presents to the ED for evaluation of weakness and fever.   Chart review indicates hx DM, CAD s/p CABG, HTN, HLD, COPD.  Patient reports he is fully vaccinated for COVID-19.  Patient reports to the ED for evaluation of COVID-19 due to multiple family members at his house recently tested positive and with symptoms of the past 1 week.  Patient reports generalized weakness, poor p.o. intake and presyncope upon standing without syncope.  Patient reports getting tripped up with his socks and falling backwards, striking his bottom on the ground causing injury that occurred this morning while getting out of bed.  Patient reports he has been ambulatory at his baseline since this fall, but reports tailbone pain.  Past Medical History:  Diagnosis Date  . Bipolar 1 disorder (HCC)   . COPD (chronic obstructive pulmonary disease) (HCC)   . Coronary artery disease   . Diabetes mellitus without complication (HCC)   . Dyspnea   . GERD (gastroesophageal reflux disease)   . Hypertension   . Sleep apnea   . STEMI (ST elevation myocardial infarction) (HCC)   . Stroke (HCC)   . TIA (transient ischemic attack)     There are no problems to display for this patient.   Past Surgical History:  Procedure Laterality Date  . CATARACT EXTRACTION W/ INTRAOCULAR LENS  IMPLANT, BILATERAL    . COLON SURGERY     diverticulitis  . CORONARY ARTERY BYPASS GRAFT    . ctr Bilateral   . KNEE ARTHROSCOPY Bilateral   . SHOULDER ARTHROSCOPY WITH OPEN ROTATOR CUFF REPAIR Right 04/23/2018   Procedure: SHOULDER ARTHROSCOPY WITH  ARTHROSCOPIC AND OPEN  ROTATOR CUFF REPAIR, DECOMPRESSION, DEBRIDEMENT;  Surgeon: Christena Flake, MD;  Location: [facility] ORS;  Service: Orthopedics;  Laterality: Right;  . SKIN GRAFT Left   . WRIST ARTHROSCOPY Right     Prior to Admission medications   Medication Sig Start Date End Date Taking? Authorizing Provider  acetaminophen (TYLENOL) 500 MG tablet Take 1,500 mg by mouth every 6 (six) hours as needed for moderate pain.     [provider]  albuterol (PROVENTIL HFA;VENTOLIN HFA) 108 (90 Base) MCG/ACT inhaler Inhale 2 puffs into the lungs every 6 (six) hours as needed for wheezing or shortness of breath.    [provider]  aspirin EC 81 MG tablet Take 162 mg by mouth daily.    [provider]  atorvastatin (LIPITOR) 40 MG tablet Take 40 mg by mouth at bedtime.    [provider]  Cholecalciferol (VITAMIN D3) 25 MCG (1000 UT) CAPS Take 1,000 Units by mouth daily.    [provider]  gabapentin (NEURONTIN) 300 MG capsule Take 300 mg by mouth at bedtime.    [provider]  glipiZIDE (GLUCOTROL) 10 MG tablet Take 10 mg by mouth 2 (two) times daily before a meal.    [provider]  losartan (COZAAR) 100 MG tablet Take 100 mg by mouth daily.    [provider]  metFORMIN (GLUCOPHAGE) 1000 MG tablet Take 1,000 mg by mouth 2 (two) times daily with a meal.    [provider]  metoprolol  tartrate (LOPRESSOR) 50 MG tablet Take 25 mg by mouth 2 (two) times daily.     [provider]  Multiple Vitamins-Minerals (CENTRUM SILVER PO) Take 1 tablet by mouth daily.    [provider]  Omega-3 Fatty Acids (FISH OIL) 1000 MG CAPS Take 1,000 mg by mouth 2 (two) times daily.    [provider]  oxyCODONE (ROXICODONE) 5 MG immediate release tablet Take 1-2 tablets (5-10 mg total) by mouth every 4 (four) hours as needed. 04/23/18   Poggi, Excell Seltzer, MD  QUEtiapine (SEROQUEL) 50 MG tablet Take 100 mg by mouth at bedtime.    [provider]  sertraline (ZOLOFT) 100 MG tablet Take 200 mg by mouth at bedtime.     [provider]    Allergies Penicillins, Tetracyclines & related, Ampicillin, and Other  No family history on file.  Social History Social History   Tobacco Use  . Smoking status: Never Smoker  . Smokeless tobacco: Never Used  Vaping Use  . Vaping Use: Never used  Substance Use Topics  . Alcohol use: Not Currently  . Drug use: Not Currently    Review of Systems  Constitutional: No fever/chills Eyes: No visual changes.  Positive generalized weakness ENT: No sore throat. Cardiovascular: Denies chest pain. Respiratory: Denies shortness of breath. Gastrointestinal: No abdominal pain.  No nausea, no vomiting.  No diarrhea.  No constipation.  Positive for poor p.o. intake Genitourinary: Negative for dysuria. Musculoskeletal: Negative for back pain.  Positive for tailbone pain. Skin: Negative for rash. Neurological: Negative for headaches, focal weakness or numbness.  ____________________________________________   PHYSICAL EXAM:  VITAL SIGNS: Vitals:   05/04/20 1653 05/04/20 1657  BP: (!) 163/79   Pulse: 83   Resp:    Temp:    SpO2: 93% 94%     Constitutional: Alert and oriented. Well appearing and in no acute distress. Eyes: Conjunctivae are normal. PERRL. EOMI. Head: Atraumatic. Nose: No congestion/rhinnorhea. Mouth/Throat: Mucous membranes are moist.  Oropharynx non-erythematous. Neck: No stridor. No cervical spine tenderness to palpation. Cardiovascular: Normal rate, regular rhythm. Grossly normal heart sounds.  Good peripheral circulation. Respiratory: Normal respiratory effort.  No retractions. Lungs CTAB. Gastrointestinal: Soft , nondistended, nontender to palpation. No CVA tenderness. Musculoskeletal: No lower extremity tenderness nor edema.  No joint effusions. No signs of acute trauma. Mild and poorly localizing tenderness over his tailbone.  No bony  step-offs to the spine or signs of trauma to the back.  No midline tenderness throughout the lumbar, thoracic and cervical spine. Neurologic:  Normal speech and language. No gross focal neurologic deficits are appreciated. No gait instability noted. Cranial nerves II through XII intact 5/5 strength and sensation in all 4 extremities Skin:  Skin is warm, dry and intact. No rash noted. Psychiatric: Mood and affect are normal. Speech and behavior are normal.  ____________________________________________   LABS (all labs ordered are listed, but only abnormal results are displayed)  Labs Reviewed  SARS CORONAVIRUS 2 BY RT PCR (HOSPITAL ORDER, PERFORMED IN [facility]) - Abnormal; Notable for the following components:      Result Value   SARS Coronavirus 2 POSITIVE (*)    All other components within normal limits  BASIC METABOLIC PANEL - Abnormal; Notable for the following components:   Sodium 134 (*)    Glucose, Bld 225 (*)    Calcium 8.5 (*)    All other components within normal limits  MAGNESIUM - Abnormal; Notable for the following components:  Magnesium 1.5 (*)    All other components within normal limits  CBC  URINALYSIS, COMPLETE (UACMP) WITH MICROSCOPIC   ____________________________________________  12 Lead EKG  Sinus rhythm, rate 85 bpm.  Rightward axis.  Prolonged QTC of 509, normal intervals.  No evidence of acute ischemia. ____________________________________________  RADIOLOGY  ED MD interpretation: Plain films of the sacrum reviewed by me with evidence of nondisplaced coccyx fracture  Official radiology report(s): DG Sacrum/Coccyx  Result Date: 05/04/2020 CLINICAL DATA:  Fall this morning with coccyx pain. EXAM: SACRUM AND COCCYX - 2+ VIEW COMPARISON:  None. FINDINGS: Cortical irregularity about the mid coccyx may represent a nondisplaced fracture. Sacroiliac joints are congruent. The sacral ala are maintained. Multiple surgical clips in the pelvis.  IMPRESSION: Cortical irregularity about the mid coccyx may represent a nondisplaced fracture. Electronically Signed   By: Narda Rutherford M.D.   On: 05/04/2020 18:20    ____________________________________________   PROCEDURES and INTERVENTIONS  Procedure(s) performed (including Critical Care):  .1-3 Lead EKG Interpretation Performed by: Delton Prairie, MD Authorized by: Delton Prairie, MD     Interpretation: normal     ECG rate:  80   ECG rate assessment: normal     Rhythm: sinus rhythm     Ectopy: none     Conduction: normal      Medications  acetaminophen (TYLENOL) tablet 1,000 mg (1,000 mg Oral Given 05/04/20 1656)    ____________________________________________   MDM / ED COURSE   70 year old male presents to the ED with positive Covid exposure, with evidence of COVID-19 and a nondisplaced coccyx fracture, amenable to outpatient management.  Presented with low-grade temperature of 100.2 F, otherwise hemodynamically stable and not hypoxic on room air.  Exam is generally reassuring.  He has some poorly localizing and mild tenderness over his tailbone, but no spinal tenderness, signs of further trauma, neurovascular deficits or distress.  Blood work is generally reassuring.  Plain films of the site of pain demonstrates evidence of nondisplaced coccyx fracture.  Patient Covid test returns positive.  No evidence of pathology to preclude outpatient management.  We discussed outpatient management and we discussed return precautions for the ED.  Patient medically stable for outpatient management.   Clinical Course as of 05/04/20 1929  Tue May 04, 2020  1509 SpO2: 94 % [MF]  1905 Reassessed. Patient reports feeling okay. We discussed positive COVID results and we discussed likely nondisplaced sacral fracture. We discussed return precautions for the ED. [DS]    Clinical Course User Index [DS] Delton Prairie, MD [MF] Concha Se, MD     ____________________________________________   FINAL CLINICAL IMPRESSION(S) / ED DIAGNOSES  Final diagnoses:  COVID-19  Weakness  Closed fracture of coccyx, initial encounter [facility])     ED Discharge Orders    None       Haroldine Redler   Note:  This document was prepared using Dragon voice recognition software and may include unintentional dictation errors.   Delton Prairie, MD 05/04/20 [phone]

## 2020-05-04 NOTE — ED Notes (Signed)
Daughter Herschell Virani updated with pt's verbal okay.

## 2020-05-05 ENCOUNTER — Telehealth: Payer: Self-pay

## 2020-05-05 NOTE — Telephone Encounter (Signed)
Called to discuss with patient about COVID-19 symptoms and the use of one of the available treatments for those with mild to moderate Covid symptoms and at a high risk of hospitalization.  Pt appears to qualify for outpatient treatment due to co-morbid conditions and/or a member of an at-risk group in accordance with the FDA Emergency Use Authorization.    Symptom onset: 05/04/20 Vaccinated: Yes Booster? No Immunocompromised? No Qualifiers: COPD,CAD,HTN  Pt. Would like to think about further treatment.  Roger Harper

## 2021-03-26 IMAGING — CR DG SACRUM/COCCYX 2+V
1 series · 3 of 3 positions shown · non-contrast
Comparison: None.

CLINICAL DATA: Fall this morning with coccyx pain.

EXAM:
SACRUM AND COCCYX - 2+ VIEW

[Series 1: dg sacrum/coccyx · 0.14mm/px · 3 of 3 slices shown]
[im 1/3]
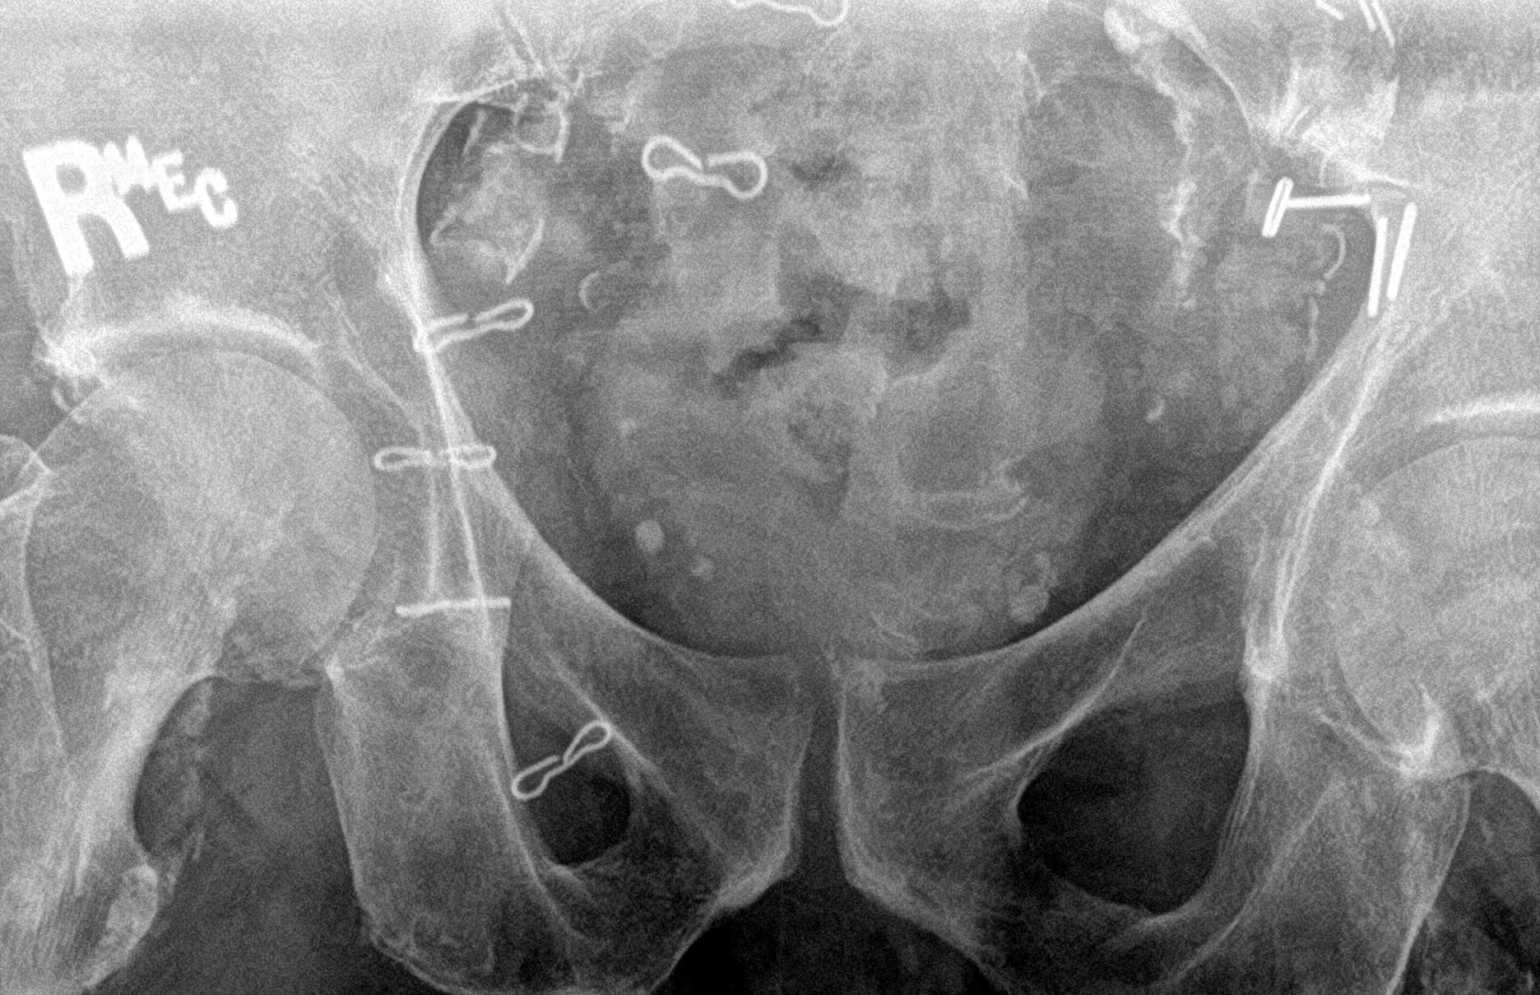
[im 2/3]
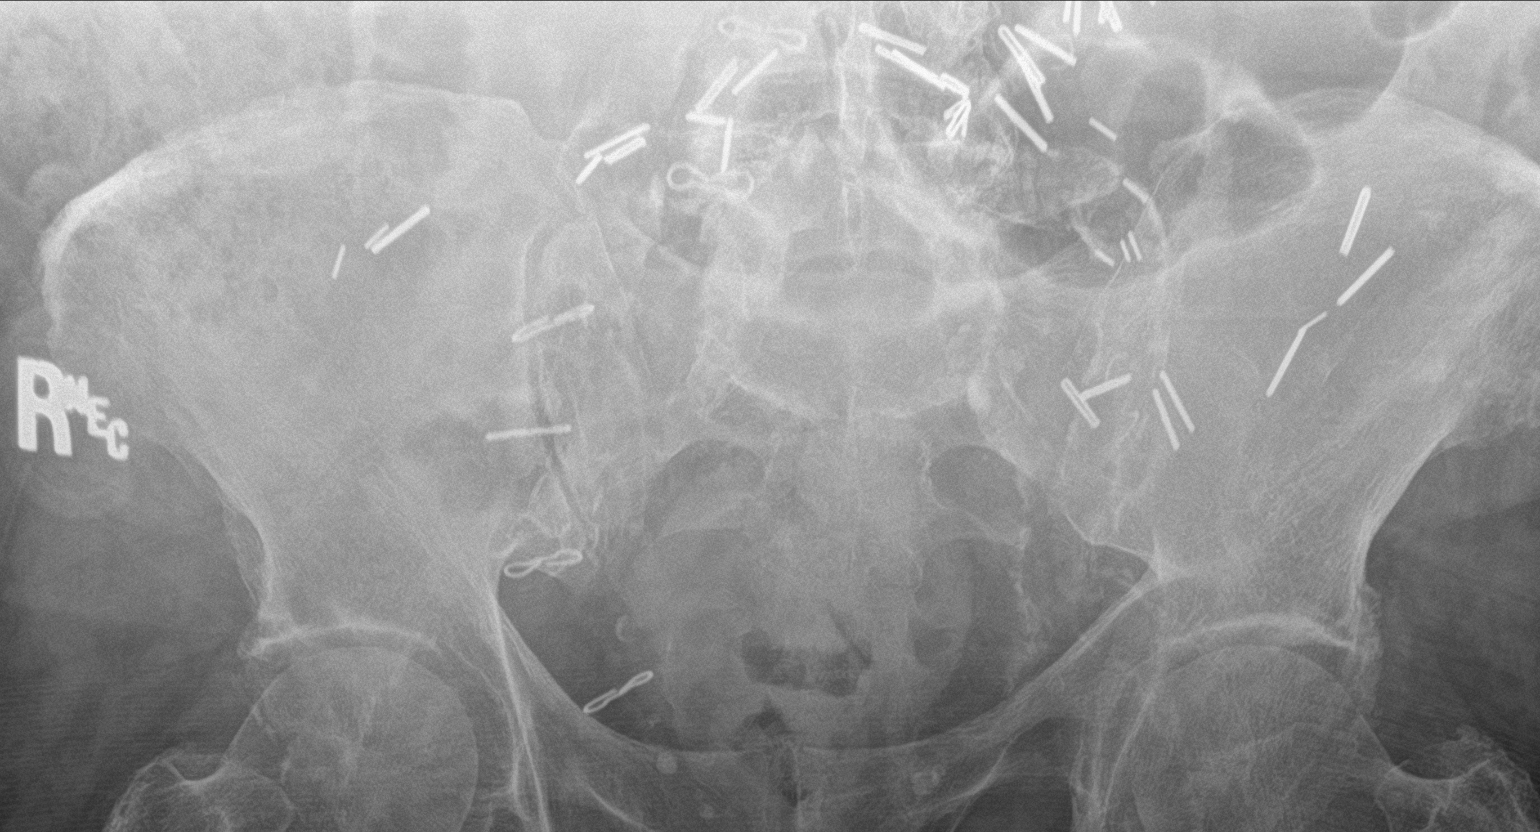
[im 3/3]
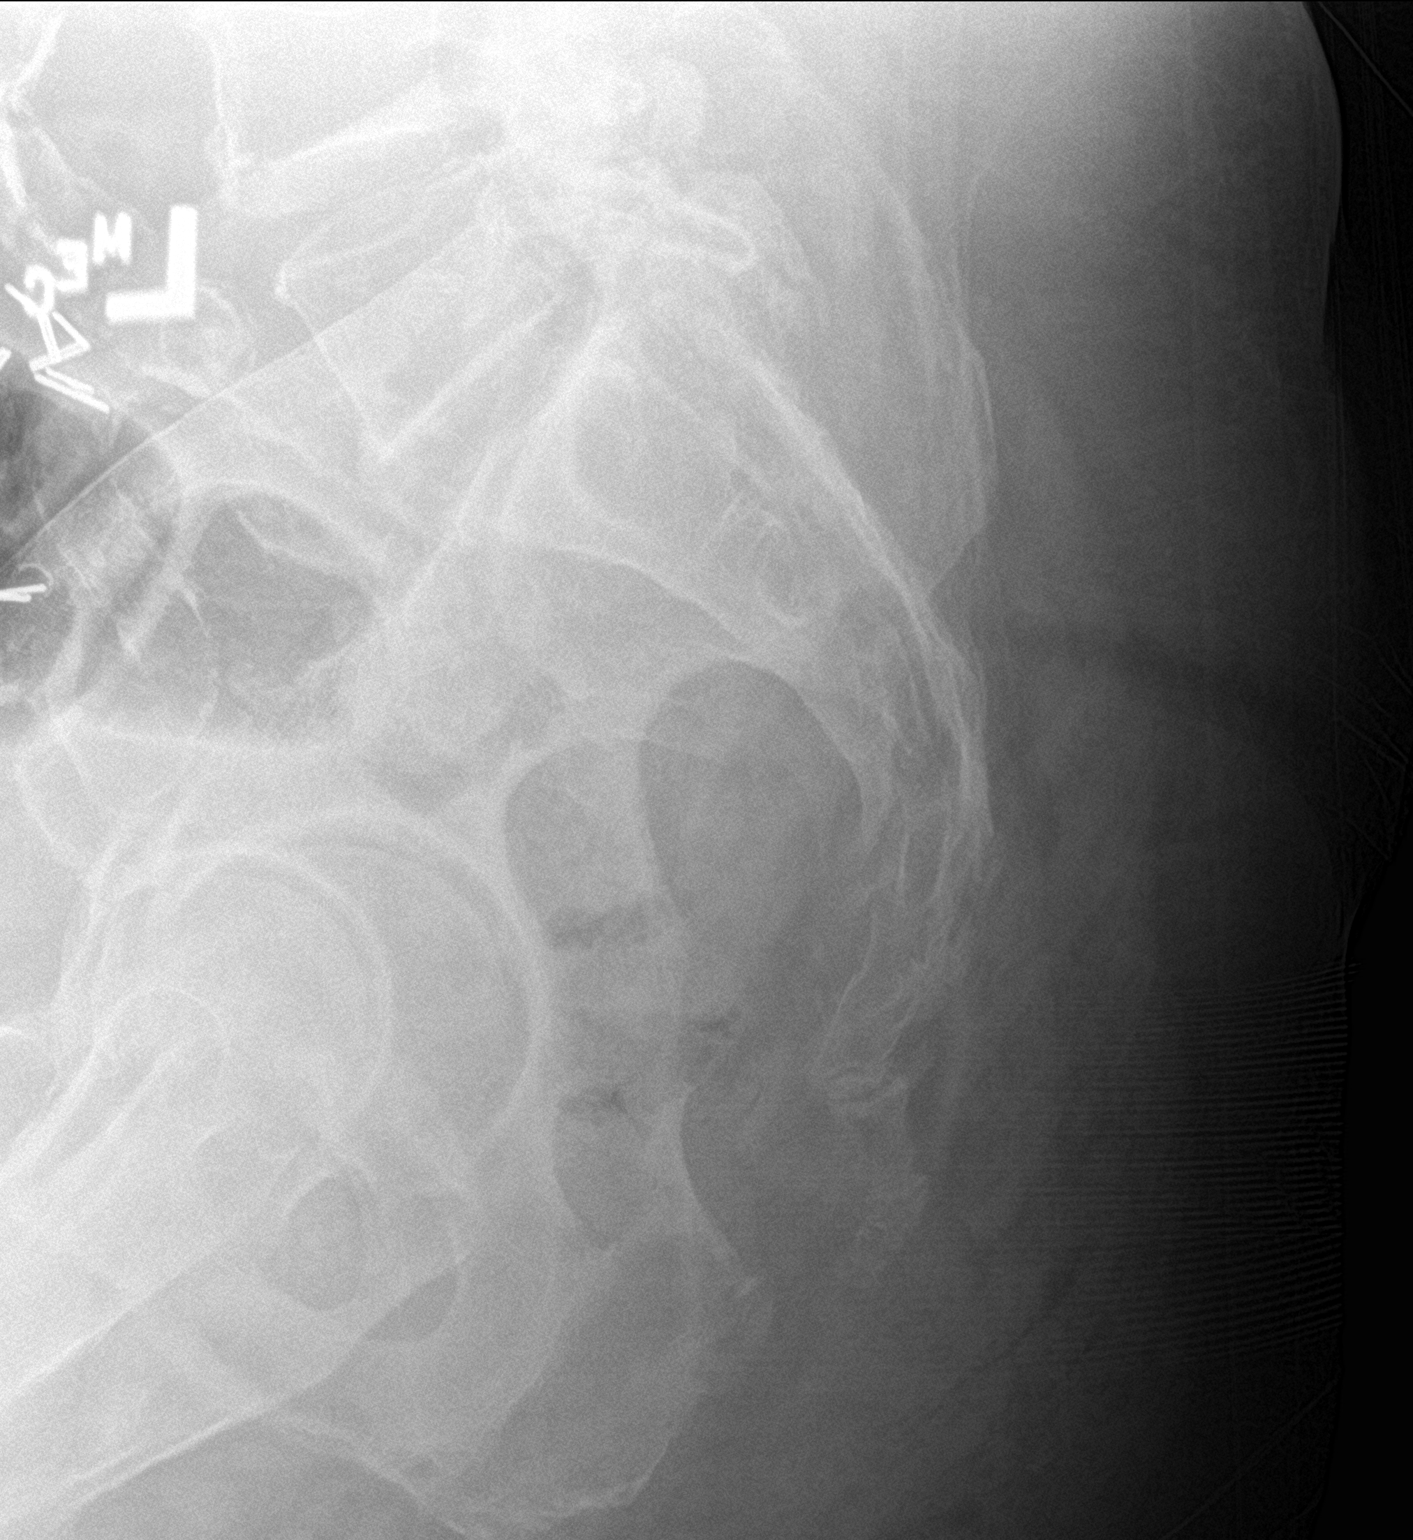

[3 of 3 positions shown; findings below may reference images not displayed]

FINDINGS: Cortical irregularity about the mid coccyx may represent a
nondisplaced fracture. Sacroiliac joints are congruent. The sacral
ala are maintained. Multiple surgical clips in the pelvis.
IMPRESSION: Cortical irregularity about the mid coccyx may represent a
nondisplaced fracture.

## 2021-05-06 ENCOUNTER — Other Ambulatory Visit: Payer: Self-pay

## 2021-05-06 DIAGNOSIS — Z1211 Encounter for screening for malignant neoplasm of colon: Secondary | ICD-10-CM

## 2021-05-06 MED ORDER — NA SULFATE-K SULFATE-MG SULF 17.5-3.13-1.6 GM/177ML PO SOLN: 1.0000 | Freq: Once | ORAL | 0 refills | Status: AC

## 2021-05-06 NOTE — Progress Notes (Signed)
Gastroenterology Pre-Procedure Review  Request Date: 05/20/2021 Requesting Physician: Dr. Tobi Bastos  PATIENT REVIEW QUESTIONS: The patient responded to the following health history questions as indicated:    1. Are you having any GI issues? no 2. Do you have a personal history of Polyps?  Unsure, it has been over 10 years. 3. Do you have a family history of Colon Cancer or Polyps? no 4. Diabetes Mellitus? yes (type II) 5. Joint replacements in the past 12 months?no 6. Major health problems in the past 3 months?yes (diabetic retinopathy) 7. Any artificial heart valves, MVP, or defibrillator?no    MEDICATIONS & ALLERGIES:    Patient reports the following regarding taking any anticoagulation/antiplatelet therapy:   Plavix, Coumadin, Eliquis, Xarelto, Lovenox, Pradaxa, Brilinta, or Effient? no Aspirin? yes (81 mg)  Patient confirms/reports the following medications:  Current Outpatient Medications  Medication Sig Dispense Refill   acetaminophen (TYLENOL) 500 MG tablet Take 1,500 mg by mouth every 6 (six) hours as needed for moderate pain.      albuterol (PROVENTIL HFA;VENTOLIN HFA) 108 (90 Base) MCG/ACT inhaler Inhale 2 puffs into the lungs every 6 (six) hours as needed for wheezing or shortness of breath.     aspirin EC 81 MG tablet Take 162 mg by mouth daily.     atorvastatin (LIPITOR) 40 MG tablet Take 40 mg by mouth at bedtime.     Cholecalciferol (VITAMIN D3) 25 MCG (1000 UT) CAPS Take 1,000 Units by mouth daily.     gabapentin (NEURONTIN) 300 MG capsule Take 300 mg by mouth at bedtime.     glipiZIDE (GLUCOTROL) 10 MG tablet Take 10 mg by mouth 2 (two) times daily before a meal.     losartan (COZAAR) 100 MG tablet Take 100 mg by mouth daily.     metFORMIN (GLUCOPHAGE) 1000 MG tablet Take 1,000 mg by mouth 2 (two) times daily with a meal.     metoprolol tartrate (LOPRESSOR) 50 MG tablet Take 25 mg by mouth 2 (two) times daily.      Multiple Vitamins-Minerals (CENTRUM SILVER PO) Take 1  tablet by mouth daily.     Omega-3 Fatty Acids (FISH OIL) 1000 MG CAPS Take 1,000 mg by mouth 2 (two) times daily.     oxyCODONE (ROXICODONE) 5 MG immediate release tablet Take 1-2 tablets (5-10 mg total) by mouth every 4 (four) hours as needed. 50 tablet 0   QUEtiapine (SEROQUEL) 50 MG tablet Take 100 mg by mouth at bedtime.     sertraline (ZOLOFT) 100 MG tablet Take 200 mg by mouth at bedtime.      No current facility-administered medications for this visit.    Patient confirms/reports the following allergies:  Allergies  Allergen Reactions   Penicillins Shortness Of Breath and Other (See Comments)    DID THE REACTION INVOLVE: Swelling of the face/tongue/throat, SOB, or low BP? Yes Sudden or severe rash/hives, skin peeling, or the inside of the mouth or nose? Yes Did it require medical treatment? Yes  When did it last happen? Around 71 years old     If all above answers are "NO", may proceed with cephalosporin use.    Tetracyclines & Related Shortness Of Breath   Ampicillin Other (See Comments)    Unknown   Other     "Novocain lose control of emotions""    No orders of the defined types were placed in this encounter.   AUTHORIZATION INFORMATION Primary Insurance: 1D#: Group #:  Secondary Insurance: 1D#: Group #:  SCHEDULE INFORMATION: Date:  05/20/2021 Time: Location: [facility]

## 2021-05-17 ENCOUNTER — Telehealth: Payer: Self-pay

## 2021-05-17 NOTE — Telephone Encounter (Signed)
Patient called in and canceled his colonoscopy having some family things going on he will call us back when he is ready to reschedule I called endo and let them know

## 2021-05-20 ENCOUNTER — Encounter: Admission: RE | Payer: Self-pay | Source: Home / Self Care

## 2021-05-20 ENCOUNTER — Ambulatory Visit: Admission: RE | Admit: 2021-05-20 | Payer: Medicare Other | Source: Home / Self Care | Admitting: Gastroenterology

## 2021-05-20 SURGERY — COLONOSCOPY WITH PROPOFOL
Anesthesia: General

## 2022-08-13 ENCOUNTER — Emergency Department
Admission: EM | Admit: 2022-08-13 | Discharge: 2022-08-13 | Disposition: A | Discharge status: Home / Self Care | Payer: No Typology Code available for payment source | Attending: Emergency Medicine | Admitting: Emergency Medicine

## 2022-08-13 ENCOUNTER — Emergency Department: Payer: No Typology Code available for payment source

## 2022-08-13 ENCOUNTER — Other Ambulatory Visit: Payer: Self-pay

## 2022-08-13 DIAGNOSIS — Z23 Encounter for immunization: Secondary | ICD-10-CM | POA: Diagnosis not present

## 2022-08-13 DIAGNOSIS — Y9301 Activity, walking, marching and hiking: Secondary | ICD-10-CM | POA: Diagnosis not present

## 2022-08-13 DIAGNOSIS — I2510 Atherosclerotic heart disease of native coronary artery without angina pectoris: Secondary | ICD-10-CM | POA: Insufficient documentation

## 2022-08-13 DIAGNOSIS — J449 Chronic obstructive pulmonary disease, unspecified: Secondary | ICD-10-CM | POA: Diagnosis not present

## 2022-08-13 DIAGNOSIS — W01190A Fall on same level from slipping, tripping and stumbling with subsequent striking against furniture, initial encounter: Secondary | ICD-10-CM | POA: Insufficient documentation

## 2022-08-13 DIAGNOSIS — W19XXXA Unspecified fall, initial encounter: Secondary | ICD-10-CM

## 2022-08-13 DIAGNOSIS — I10 Essential (primary) hypertension: Secondary | ICD-10-CM | POA: Diagnosis not present

## 2022-08-13 DIAGNOSIS — S0990XA Unspecified injury of head, initial encounter: Secondary | ICD-10-CM | POA: Insufficient documentation

## 2022-08-13 DIAGNOSIS — Z7982 Long term (current) use of aspirin: Secondary | ICD-10-CM | POA: Insufficient documentation

## 2022-08-13 DIAGNOSIS — E1165 Type 2 diabetes mellitus with hyperglycemia: Secondary | ICD-10-CM | POA: Diagnosis not present

## 2022-08-13 LAB — BASIC METABOLIC PANEL
Anion gap: 7 (ref 5–15)
BUN: 28 mg/dL — ABNORMAL HIGH (ref 8–23)
CO2: 25 mmol/L (ref 22–32)
Calcium: 8.6 mg/dL — ABNORMAL LOW (ref 8.9–10.3)
Chloride: 102 mmol/L (ref 98–111)
Creatinine, Ser: 1.17 mg/dL (ref 0.61–1.24)
GFR, Estimated: >60 mL/min (ref >60)
Glucose, Bld: 170 mg/dL — ABNORMAL HIGH (ref 70–99)
Potassium: 3.7 mmol/L (ref 3.5–5.1)
Sodium: 134 mmol/L — ABNORMAL LOW (ref 135–145)

## 2022-08-13 LAB — CBC
HCT: 43.2 % (ref 39.0–52.0)
Hemoglobin: 13.7 g/dL (ref 13.0–17.0)
MCH: 28.1 pg (ref 26.0–34.0)
MCHC: 31.7 g/dL (ref 30.0–36.0)
MCV: 88.5 fL (ref 80.0–100.0)
Platelets: 173 10*3/uL (ref 150–400)
RBC: 4.88 MIL/uL (ref 4.22–5.81)
RDW: 13 % (ref 11.5–15.5)
WBC: 8.3 10*3/uL (ref 4.0–10.5)
nRBC: 0 % (ref 0.0–0.2)

## 2022-08-13 MED ORDER — TETANUS-DIPHTH-ACELL PERTUSSIS 5-2.5-18.5 LF-MCG/0.5 IM SUSY
0.5000 mL | PREFILLED_SYRINGE | Freq: Once | INTRAMUSCULAR | Status: AC
  Administered 2022-08-13: 0.5 mL via INTRAMUSCULAR
  Filled 2022-08-13: qty 0.5

## 2022-08-13 NOTE — ED Provider Notes (Signed)
[facility] Provider Note    Event Date/Time   First MD Initiated Contact with Patient 08/13/22 [phone]     (approximate)   History   Fall   HPI  Constant Roger Harper. is a 72 y.o. male with a past medical history of diabetes, bipolar disorder, hypertension, coronary artery disease status post CABG, who presents today for evaluation after a trip and fall.  Patient reports he was walking in the dark and stepped on something and spun around and struck his head on something plastic on the way down.  He denies loss of consciousness.  He denies any preceding symptoms.  He did not have any chest pain, shortness of breath, dizziness, palpitations, or any other symptoms.  He reports that he was able to get himself up with assistance.  He lives with his daughter and her husband.  To ambulate since it happened.  He denies any complaints currently.  Patient Active Problem List   Diagnosis Date Noted   Mixed hyperlipidemia 10/11/2018   Background diabetic retinopathy associated with type 2 diabetes mellitus (HCC) 09/25/2018   Controlled type 2 diabetes mellitus with complication, without long-term current use of insulin (HCC) 06/27/2018   Bipolar 1 disorder (HCC) 06/27/2018   COPD (chronic obstructive pulmonary disease) (HCC) 06/27/2018   Coronary artery disease involving native coronary artery of native heart 06/27/2018   Essential hypertension 06/27/2018   OSA (obstructive sleep apnea) 06/27/2018   Rotator cuff tendinitis, right 01/14/2018   Traumatic incomplete tear of right rotator cuff 01/14/2018          Physical Exam   Triage Vital Signs: ED Triage Vitals  Enc Vitals Group     BP 08/13/22 0812 (!) 183/94     Pulse Rate 08/13/22 0812 (!) 48     Resp 08/13/22 0812 18     Temp 08/13/22 0812 98 F (36.7 C)     Temp src --      SpO2 08/13/22 0812 96 %     Weight 08/13/22 0813 270 lb (122.5 kg)     Height 08/13/22 0813 5\' 9"  (1.753 m)     Head  Circumference --      Peak Flow --      Pain Score 08/13/22 0813 0     Pain Loc --      Pain Edu? --      Excl. in GC? --     Most recent vital signs: Vitals:   08/13/22 0812  BP: (!) 183/94  Pulse: (!) 48  Resp: 18  Temp: 98 F (36.7 C)  SpO2: 96%    Physical Exam Vitals and nursing note reviewed.  Constitutional:      General: Awake and alert. No acute distress.    Appearance: Normal appearance. The patient is obese HENT:     Head: Normocephalic. Superficial abrasion noted to occiput, no hematoma. No battle sign or raccoon eyes    Mouth: Mucous membranes are moist.  Eyes:     General: PERRL. Normal EOMs        Right eye: No discharge.        Left eye: No discharge.     Conjunctiva/sclera: Conjunctivae normal.  Cardiovascular:     Rate and Rhythm: Normal rate and regular rhythm.     Pulses: Normal pulses.  Pulmonary:     Effort: Pulmonary effort is normal. No respiratory distress.     Breath sounds: Normal breath sounds.  Abdominal:     Abdomen  is soft. There is no abdominal tenderness. No rebound or guarding. No distention. Musculoskeletal:        General: No swelling. Normal range of motion.     Cervical back: Normal range of motion and neck supple.  Skin:    General: Skin is warm and dry.     Capillary Refill: Capillary refill takes less than 2 seconds.     Findings: No rash.  Neurological:     Mental Status: The patient is awake and alert.   Neurological: GCS 15 alert and oriented x3 Normal speech, no expressive or receptive aphasia or dysarthria Cranial nerves II through XII intact Normal visual fields 5 out of 5 strength in all 4 extremities with intact sensation throughout No extremity drift Normal finger-to-nose testing, no limb or truncal ataxia    ED Results / Procedures / Treatments   Labs (all labs ordered are listed, but only abnormal results are displayed) Labs Reviewed  BASIC METABOLIC PANEL - Abnormal; Notable for the following  components:      Result Value   Sodium 134 (*)    Glucose, Bld 170 (*)    BUN 28 (*)    Calcium 8.6 (*)    All other components within normal limits  CBC  URINALYSIS, ROUTINE W REFLEX MICROSCOPIC  CBG MONITORING, ED     EKG     RADIOLOGY I independently reviewed and interpreted imaging and agree with radiologists findings.     PROCEDURES:  Critical Care performed:   Procedures   MEDICATIONS ORDERED IN ED: Medications  Tdap (BOOSTRIX) injection 0.5 mL (0.5 mLs Intramuscular Given 08/13/22 1054)     IMPRESSION / MDM / ASSESSMENT AND PLAN / ED COURSE  I reviewed the triage vital signs and the nursing notes.   Differential diagnosis includes, but is not limited to, contusion, concussion, intracranial hemorrhage, cervical spine injury.  Patient is awake and alert, hemodynamically stable and afebrile.  He is quite certain that there was no syncope or presyncope for or after the fall.  There is no preceding symptoms.  Patient denies any complaints currently, reports that he only came at his daughter's insistence.  He has no focal neurological deficits.  He is at his baseline mental status.  He is answering all questions appropriately.  There are no battle signs or raccoon eyes.  He has full normal range of motion of all 4 extremities and he is able to bear weight unassisted.  CT head and neck obtained per [place] criteria are negative for any acute injuries.  Labs obtained in triage are overall reassuring.  He is hyperglycemic but there is no signs of DKA and he is compliant with his home medications.  Patient and daughter are reassured by his findings today.  We discussed return precautions and importance of close outpatient follow-up.  We also discussed the slight risk for delayed head bleed given his aspirin therapy, we discussed return precautions for this.  Patient understands and agrees with plan.  He was discharged in stable condition with his daughter.  Patient's  presentation is most consistent with acute complicated illness / injury requiring diagnostic workup.      FINAL CLINICAL IMPRESSION(S) / ED DIAGNOSES   Final diagnoses:  Fall, initial encounter  Injury of head, initial encounter     Rx / DC Orders   ED Discharge Orders     None        Note:  This document was prepared using Dragon voice recognition software and may include  unintentional dictation errors.   Keturah Shavers 08/13/22 1255    Minna Antis, MD 08/13/22 1447

## 2022-08-13 NOTE — Discharge Instructions (Signed)
Your blood test and CT scans are normal.  Please return for any new, worsening, or change in symptoms or other concerns.  It was a pleasure caring for you today.

## 2022-08-13 NOTE — ED Triage Notes (Signed)
Pt states he was walking to the bathroom and fell about 30 minutes ago. 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 Pt denies pain. Pt states he hit his right head on a desk. Pt reports taking 81mg  of aspirin a day.  Pt speech is normal and no facial droop noted. Pt denies all other complaints

## 2022-08-16 ENCOUNTER — Emergency Department: Payer: No Typology Code available for payment source

## 2022-08-16 ENCOUNTER — Emergency Department
Admission: EM | Admit: 2022-08-16 | Discharge: 2022-08-16 | Disposition: A | Discharge status: Home / Self Care | Payer: No Typology Code available for payment source | Attending: Emergency Medicine | Admitting: Emergency Medicine

## 2022-08-16 ENCOUNTER — Encounter: Payer: Self-pay | Admitting: Emergency Medicine

## 2022-08-16 ENCOUNTER — Other Ambulatory Visit: Payer: Self-pay

## 2022-08-16 DIAGNOSIS — Z5321 Procedure and treatment not carried out due to patient leaving prior to being seen by health care provider: Secondary | ICD-10-CM | POA: Insufficient documentation

## 2022-08-16 DIAGNOSIS — R4182 Altered mental status, unspecified: Secondary | ICD-10-CM | POA: Insufficient documentation

## 2022-08-16 DIAGNOSIS — Z8673 Personal history of transient ischemic attack (TIA), and cerebral infarction without residual deficits: Secondary | ICD-10-CM | POA: Insufficient documentation

## 2022-08-16 LAB — URINALYSIS, ROUTINE W REFLEX MICROSCOPIC
Bilirubin Urine: NEGATIVE
Glucose, UA: NEGATIVE mg/dL
Hgb urine dipstick: NEGATIVE
Ketones, ur: NEGATIVE mg/dL
Leukocytes,Ua: NEGATIVE
Nitrite: NEGATIVE
Protein, ur: NEGATIVE mg/dL
Specific Gravity, Urine: 1.002 — ABNORMAL LOW (ref 1.005–1.030)
pH: 7 (ref 5.0–8.0)

## 2022-08-16 LAB — CBC WITH DIFFERENTIAL/PLATELET
Abs Immature Granulocytes: 0.06 10*3/uL (ref 0.00–0.07)
Basophils Absolute: 0 10*3/uL (ref 0.0–0.1)
Basophils Relative: 0 %
Eosinophils Absolute: 0.2 10*3/uL (ref 0.0–0.5)
Eosinophils Relative: 2 %
HCT: 44.1 % (ref 39.0–52.0)
Hemoglobin: 14.1 g/dL (ref 13.0–17.0)
Immature Granulocytes: 1 %
Lymphocytes Relative: 20 %
Lymphs Abs: 2 10*3/uL (ref 0.7–4.0)
MCH: 28 pg (ref 26.0–34.0)
MCHC: 32 g/dL (ref 30.0–36.0)
MCV: 87.7 fL (ref 80.0–100.0)
Monocytes Absolute: 0.7 10*3/uL (ref 0.1–1.0)
Monocytes Relative: 7 %
Neutro Abs: 7 10*3/uL (ref 1.7–7.7)
Neutrophils Relative %: 70 %
Platelets: 182 10*3/uL (ref 150–400)
RBC: 5.03 MIL/uL (ref 4.22–5.81)
RDW: 13 % (ref 11.5–15.5)
WBC: 9.9 10*3/uL (ref 4.0–10.5)
nRBC: 0 % (ref 0.0–0.2)

## 2022-08-16 LAB — COMPREHENSIVE METABOLIC PANEL
ALT: 17 U/L (ref 0–44)
AST: 16 U/L (ref 15–41)
Albumin: 3.8 g/dL (ref 3.5–5.0)
Alkaline Phosphatase: 92 U/L (ref 38–126)
Anion gap: 8 (ref 5–15)
BUN: 30 mg/dL — ABNORMAL HIGH (ref 8–23)
CO2: 27 mmol/L (ref 22–32)
Calcium: 9.2 mg/dL (ref 8.9–10.3)
Chloride: 99 mmol/L (ref 98–111)
Creatinine, Ser: 1.08 mg/dL (ref 0.61–1.24)
GFR, Estimated: >60 mL/min (ref >60)
Glucose, Bld: 183 mg/dL — ABNORMAL HIGH (ref 70–99)
Potassium: 3.8 mmol/L (ref 3.5–5.1)
Sodium: 134 mmol/L — ABNORMAL LOW (ref 135–145)
Total Bilirubin: 0.7 mg/dL (ref 0.3–1.2)
Total Protein: 7.5 g/dL (ref 6.5–8.1)

## 2022-08-16 LAB — CBG MONITORING, ED: Glucose-Capillary: 173 mg/dL — ABNORMAL HIGH (ref 70–99)

## 2022-08-16 NOTE — ED Triage Notes (Addendum)
Pt to STAT desk via w/c with no distress noted; pt accomp by daughter who is concerned pt may be having a stroke; st was on the phone talking with him at 7pm and at 730pm began "talking gibberish"; pt currently A&Ox3, PERRL, MAEW, and speech clear; grips = & strong with no c/o; st diff speaking lasted approx ; was here on Sunday for a fall and had CT scan; took 2-81mg  ASA PTA

## 2022-08-16 NOTE — ED Triage Notes (Signed)
Pt presents ambulatory to triage via POV with complaints of AMS today and possible "TIA" per daughter. Pt has a hx of stroke and TIAs in the past where he is forgetful and speaks "gibberish". Of note, the patient fell on Sunday with no LOC and was seen here and D/C. A&Ox4 at this time. Denies CP or SOB.   CBG -173

## 2022-08-31 ENCOUNTER — Emergency Department: Payer: No Typology Code available for payment source

## 2022-08-31 ENCOUNTER — Emergency Department
Admission: EM | Admit: 2022-08-31 | Discharge: 2022-08-31 | Disposition: A | Discharge status: Home / Self Care | Payer: No Typology Code available for payment source | Attending: Student in an Organized Health Care Education/Training Program | Admitting: Student in an Organized Health Care Education/Training Program

## 2022-08-31 ENCOUNTER — Other Ambulatory Visit: Payer: Self-pay

## 2022-08-31 DIAGNOSIS — J449 Chronic obstructive pulmonary disease, unspecified: Secondary | ICD-10-CM | POA: Insufficient documentation

## 2022-08-31 DIAGNOSIS — W07XXXA Fall from chair, initial encounter: Secondary | ICD-10-CM | POA: Insufficient documentation

## 2022-08-31 DIAGNOSIS — S0081XA Abrasion of other part of head, initial encounter: Secondary | ICD-10-CM | POA: Diagnosis not present

## 2022-08-31 DIAGNOSIS — E119 Type 2 diabetes mellitus without complications: Secondary | ICD-10-CM | POA: Insufficient documentation

## 2022-08-31 DIAGNOSIS — S0990XA Unspecified injury of head, initial encounter: Secondary | ICD-10-CM

## 2022-08-31 DIAGNOSIS — M79644 Pain in right finger(s): Secondary | ICD-10-CM | POA: Insufficient documentation

## 2022-08-31 DIAGNOSIS — W19XXXA Unspecified fall, initial encounter: Secondary | ICD-10-CM

## 2022-08-31 LAB — TROPONIN I (HIGH SENSITIVITY): Troponin I (High Sensitivity): 15 ng/L (ref <18)

## 2022-08-31 LAB — BASIC METABOLIC PANEL
Anion gap: 8 (ref 5–15)
BUN: 28 mg/dL — ABNORMAL HIGH (ref 8–23)
CO2: 26 mmol/L (ref 22–32)
Calcium: 8.6 mg/dL — ABNORMAL LOW (ref 8.9–10.3)
Chloride: 105 mmol/L (ref 98–111)
Creatinine, Ser: 1.03 mg/dL (ref 0.61–1.24)
GFR, Estimated: >60 mL/min (ref >60)
Glucose, Bld: 181 mg/dL — ABNORMAL HIGH (ref 70–99)
Potassium: 3.9 mmol/L (ref 3.5–5.1)
Sodium: 139 mmol/L (ref 135–145)

## 2022-08-31 LAB — CBC
HCT: 43.8 % (ref 39.0–52.0)
Hemoglobin: 13.7 g/dL (ref 13.0–17.0)
MCH: 27.5 pg (ref 26.0–34.0)
MCHC: 31.3 g/dL (ref 30.0–36.0)
MCV: 87.8 fL (ref 80.0–100.0)
Platelets: 143 10*3/uL — ABNORMAL LOW (ref 150–400)
RBC: 4.99 MIL/uL (ref 4.22–5.81)
RDW: 13.4 % (ref 11.5–15.5)
WBC: 9.6 10*3/uL (ref 4.0–10.5)
nRBC: 0 % (ref 0.0–0.2)

## 2022-08-31 NOTE — ED Notes (Signed)
Pt to CT

## 2022-08-31 NOTE — ED Provider Notes (Signed)
[facility] Provider Note    Event Date/Time   First MD Initiated Contact with Patient 08/31/22 [phone]     (approximate)   History   Fall   HPI  Roger Harper. is a 72 y.o. male history of diabetes, bipolar disorder, COPD presents to the ER for evaluation of fall occurred last night.  Patient was sitting in chair at desk and according to family member has frequent history of falling asleep will falls out of the chair.  Patient denies any pain no chest pain or shortness of breath no nausea or vomiting.  Is complaining of some pain of his right thumb.  He does have an abrasion to left forehead.  Does not recall hitting his head.  No blurry vision.  No numbness or tingling.     Physical Exam   Triage Vital Signs: ED Triage Vitals  Enc Vitals Group     BP 08/31/22 0705 (!) 180/93     Pulse Rate 08/31/22 0705 (!) 52     Resp 08/31/22 0705 20     Temp 08/31/22 0705 97.9 F (36.6 C)     Temp src --      SpO2 08/31/22 0705 94 %     Weight 08/31/22 0705 268 lb 15.4 oz (122 kg)     Height 08/31/22 0705 5\' 9"  (1.753 m)     Head Circumference --      Peak Flow --      Pain Score 08/31/22 0704 3     Pain Loc --      Pain Edu? --      Excl. in GC? --     Most recent vital signs: Vitals:   08/31/22 0705 08/31/22 0800  BP: (!) 180/93 (!) 173/82  Pulse: (!) 52 (!) 52  Resp: 20   Temp: 97.9 F (36.6 C)   SpO2: 94% 96%     Constitutional: Alert  Eyes: Conjunctivae are normal.  Head: Superficial abrasion of the left forehead.  No swelling.  No Battle sign no raccoon eyes. Nose: No congestion/rhinnorhea. Mouth/Throat: Mucous membranes are moist.   Neck: Painless ROM.  Cardiovascular:   Good peripheral circulation. Respiratory: Normal respiratory effort.  No retractions.  Gastrointestinal: Soft and nontender.  Musculoskeletal: Swelling of the right thumb.  Some ecchymosis.  No deformity. Neurologic:  MAE spontaneously. No gross focal  neurologic deficits are appreciated.  Skin:  Skin is warm, dry and intact. No rash noted. Psychiatric: Mood and affect are normal. Speech and behavior are normal.    ED Results / Procedures / Treatments   Labs (all labs ordered are listed, but only abnormal results are displayed) Labs Reviewed  CBC - Abnormal; Notable for the following components:      Result Value   Platelets 143 (*)    All other components within normal limits  BASIC METABOLIC PANEL - Abnormal; Notable for the following components:   Glucose, Bld 181 (*)    BUN 28 (*)    Calcium 8.6 (*)    All other components within normal limits  TROPONIN I (HIGH SENSITIVITY)  TROPONIN I (HIGH SENSITIVITY)     EKG  ED ECG REPORT I, Willy Eddy, the attending physician, personally viewed and interpreted this ECG.   Date: 08/31/2022  EKG Time: 7:08  Rate: 60  Rhythm: sinus  Axis: normal  Intervals: prolonged qt  ST&T Change: no stemi, no depression    RADIOLOGY Please see ED Course for my review and  interpretation.  I personally reviewed all radiographic images ordered to evaluate for the above acute complaints and reviewed radiology reports and findings.  These findings were personally discussed with the patient.  Please see medical record for radiology report.    PROCEDURES:  Critical Care performed: No  Procedures   MEDICATIONS ORDERED IN ED: Medications - No data to display   IMPRESSION / MDM / ASSESSMENT AND PLAN / ED COURSE  I reviewed the triage vital signs and the nursing notes.                              Differential diagnosis includes, but is not limited to, SDH, IPH, fracture, concussion, contusion, dysrhythmia, dehydration, dislocation, anemia, electrolyte abnormality.   Patient presenting to the ER for evaluation of symptoms as described above.  Based on symptoms, risk factors and considered above differential, this presenting complaint could reflect a potentially  life-threatening illness therefore the patient will be placed on continuous pulse oximetry and telemetry for monitoring.  Laboratory evaluation will be sent to evaluate for the above complaints.      Clinical Course as of 08/31/22 0930  Thu Aug 31, 2022  8295 CT imaging on my review and interpretation without evidence of SDH or IPH.  Does have hematoma but no laceration.  No sign of skull fracture.  Incidental finding of dislocated cataract lens is known to the patient and they had follow-up with ophthalmology yesterday regarding this. [PR]  0829 DG Chest Portable 1 View [PR]  [phone] Imaging is reassuring.  Patient in no acute distress.  No additional questions or concerns per family.  Just wanted to make sure he did not have any other sign of head injury.  He is not having any neck pain.  Does appear stable and appropriate for outpatient follow-up. [PR]    Clinical Course User Index [PR] Willy Eddy, MD     FINAL CLINICAL IMPRESSION(S) / ED DIAGNOSES   Final diagnoses:  Fall, initial encounter  Injury of head, initial encounter     Rx / DC Orders   ED Discharge Orders     None        Note:  This document was prepared using Dragon voice recognition software and may include unintentional dictation errors.    Willy Eddy, MD 08/31/22 0930

## 2022-08-31 NOTE — ED Triage Notes (Signed)
Pt to ED for fall at unknown time. Daughter reports when she got up to go to work this am pt was found sleeping on the floor. Abrasion noted to left forehead. Pt states does not remember falling.
